# Patient Record
Sex: Male | Born: 1955 | Race: White | Hispanic: No | Marital: Single | Smoking: Former smoker
Health system: Southern US, Community
[De-identification: ages and names within clinical notes are randomized; demographics above are authoritative.]

## PROBLEM LIST (undated history)

## (undated) DIAGNOSIS — I4891 Unspecified atrial fibrillation: Secondary | ICD-10-CM

## (undated) DIAGNOSIS — J189 Pneumonia, unspecified organism: Secondary | ICD-10-CM

## (undated) DIAGNOSIS — J449 Chronic obstructive pulmonary disease, unspecified: Secondary | ICD-10-CM

## (undated) DIAGNOSIS — I2699 Other pulmonary embolism without acute cor pulmonale: Secondary | ICD-10-CM

## (undated) DIAGNOSIS — I10 Essential (primary) hypertension: Secondary | ICD-10-CM

## (undated) DIAGNOSIS — I96 Gangrene, not elsewhere classified: Secondary | ICD-10-CM

## (undated) DIAGNOSIS — I340 Nonrheumatic mitral (valve) insufficiency: Secondary | ICD-10-CM

## (undated) HISTORY — DX: Unspecified atrial fibrillation: I48.91

## (undated) HISTORY — DX: Essential (primary) hypertension: I10

## (undated) HISTORY — DX: Pneumonia, unspecified organism: J18.9

## (undated) HISTORY — DX: Nonrheumatic mitral (valve) insufficiency: I34.0

## (undated) HISTORY — DX: Gangrene, not elsewhere classified: I96

## (undated) HISTORY — DX: Other pulmonary embolism without acute cor pulmonale: I26.99

## (undated) HISTORY — DX: Chronic obstructive pulmonary disease, unspecified: J44.9

---

## 2007-04-08 ENCOUNTER — Ambulatory Visit: Payer: Self-pay | Admitting: Internal Medicine

## 2007-04-08 ENCOUNTER — Inpatient Hospital Stay (HOSPITAL_COMMUNITY): Admission: AD | Admit: 2007-04-08 | Discharge: 2007-05-18 | Payer: Self-pay | Admitting: Critical Care Medicine

## 2007-04-10 ENCOUNTER — Encounter: Payer: Self-pay | Admitting: Internal Medicine

## 2007-04-10 ENCOUNTER — Ambulatory Visit: Payer: Self-pay | Admitting: Surgery

## 2007-04-15 ENCOUNTER — Encounter: Payer: Self-pay | Admitting: Thoracic Surgery

## 2007-04-19 ENCOUNTER — Encounter (INDEPENDENT_AMBULATORY_CARE_PROVIDER_SITE_OTHER): Payer: Self-pay | Admitting: Pulmonary Disease

## 2007-04-30 ENCOUNTER — Ambulatory Visit: Payer: Self-pay | Admitting: Internal Medicine

## 2007-05-01 HISTORY — PX: THORACOTOMY: SUR1349

## 2007-06-13 ENCOUNTER — Encounter: Admission: RE | Admit: 2007-06-13 | Discharge: 2007-06-13 | Payer: Self-pay | Admitting: Thoracic Surgery

## 2007-06-13 ENCOUNTER — Ambulatory Visit: Payer: Self-pay | Admitting: Thoracic Surgery

## 2007-06-20 ENCOUNTER — Ambulatory Visit: Payer: Self-pay | Admitting: Internal Medicine

## 2007-06-20 ENCOUNTER — Ambulatory Visit: Payer: Self-pay | Admitting: Critical Care Medicine

## 2007-06-20 DIAGNOSIS — I059 Rheumatic mitral valve disease, unspecified: Secondary | ICD-10-CM | POA: Insufficient documentation

## 2007-06-20 DIAGNOSIS — J449 Chronic obstructive pulmonary disease, unspecified: Secondary | ICD-10-CM

## 2007-06-20 DIAGNOSIS — Z8679 Personal history of other diseases of the circulatory system: Secondary | ICD-10-CM

## 2007-06-20 DIAGNOSIS — J869 Pyothorax without fistula: Secondary | ICD-10-CM | POA: Insufficient documentation

## 2007-06-20 DIAGNOSIS — I10 Essential (primary) hypertension: Secondary | ICD-10-CM | POA: Insufficient documentation

## 2007-06-20 DIAGNOSIS — I4891 Unspecified atrial fibrillation: Secondary | ICD-10-CM

## 2007-07-10 ENCOUNTER — Ambulatory Visit: Payer: Self-pay | Admitting: Thoracic Surgery

## 2007-07-10 ENCOUNTER — Encounter: Admission: RE | Admit: 2007-07-10 | Discharge: 2007-07-10 | Payer: Self-pay | Admitting: Thoracic Surgery

## 2007-08-19 ENCOUNTER — Ambulatory Visit: Payer: Self-pay | Admitting: Cardiovascular Disease

## 2007-08-19 ENCOUNTER — Ambulatory Visit: Payer: Self-pay | Admitting: Critical Care Medicine

## 2007-08-19 DIAGNOSIS — Z86718 Personal history of other venous thrombosis and embolism: Secondary | ICD-10-CM | POA: Insufficient documentation

## 2007-08-26 ENCOUNTER — Telehealth: Payer: Self-pay | Admitting: Critical Care Medicine

## 2007-08-28 ENCOUNTER — Ambulatory Visit: Payer: Self-pay | Admitting: Critical Care Medicine

## 2007-09-11 ENCOUNTER — Telehealth: Payer: Self-pay | Admitting: Critical Care Medicine

## 2007-09-18 ENCOUNTER — Ambulatory Visit: Payer: Self-pay | Admitting: Thoracic Surgery

## 2007-10-30 ENCOUNTER — Ambulatory Visit: Payer: Self-pay | Admitting: Internal Medicine

## 2007-11-21 ENCOUNTER — Encounter: Admission: RE | Admit: 2007-11-21 | Discharge: 2007-11-21 | Payer: Self-pay | Admitting: Thoracic Surgery

## 2007-11-21 ENCOUNTER — Ambulatory Visit: Payer: Self-pay | Admitting: Thoracic Surgery

## 2008-03-02 ENCOUNTER — Telehealth (INDEPENDENT_AMBULATORY_CARE_PROVIDER_SITE_OTHER): Payer: Self-pay | Admitting: *Deleted

## 2008-03-06 ENCOUNTER — Ambulatory Visit: Payer: Self-pay | Admitting: Internal Medicine

## 2008-04-17 ENCOUNTER — Ambulatory Visit: Payer: Self-pay | Admitting: Critical Care Medicine

## 2008-04-17 DIAGNOSIS — G8922 Chronic post-thoracotomy pain: Secondary | ICD-10-CM

## 2008-04-24 ENCOUNTER — Encounter: Payer: Self-pay | Admitting: Critical Care Medicine

## 2008-05-06 IMAGING — CR DG ABD PORTABLE 1V
2 series · 2 of 2 positions shown · non-contrast
Comparison: 05/06/07.

CLINICAL DATA: Panda tube placement.
 PORTABLE ABDOMEN ? 1 VIEW:

[view not recorded (1 of 2)]
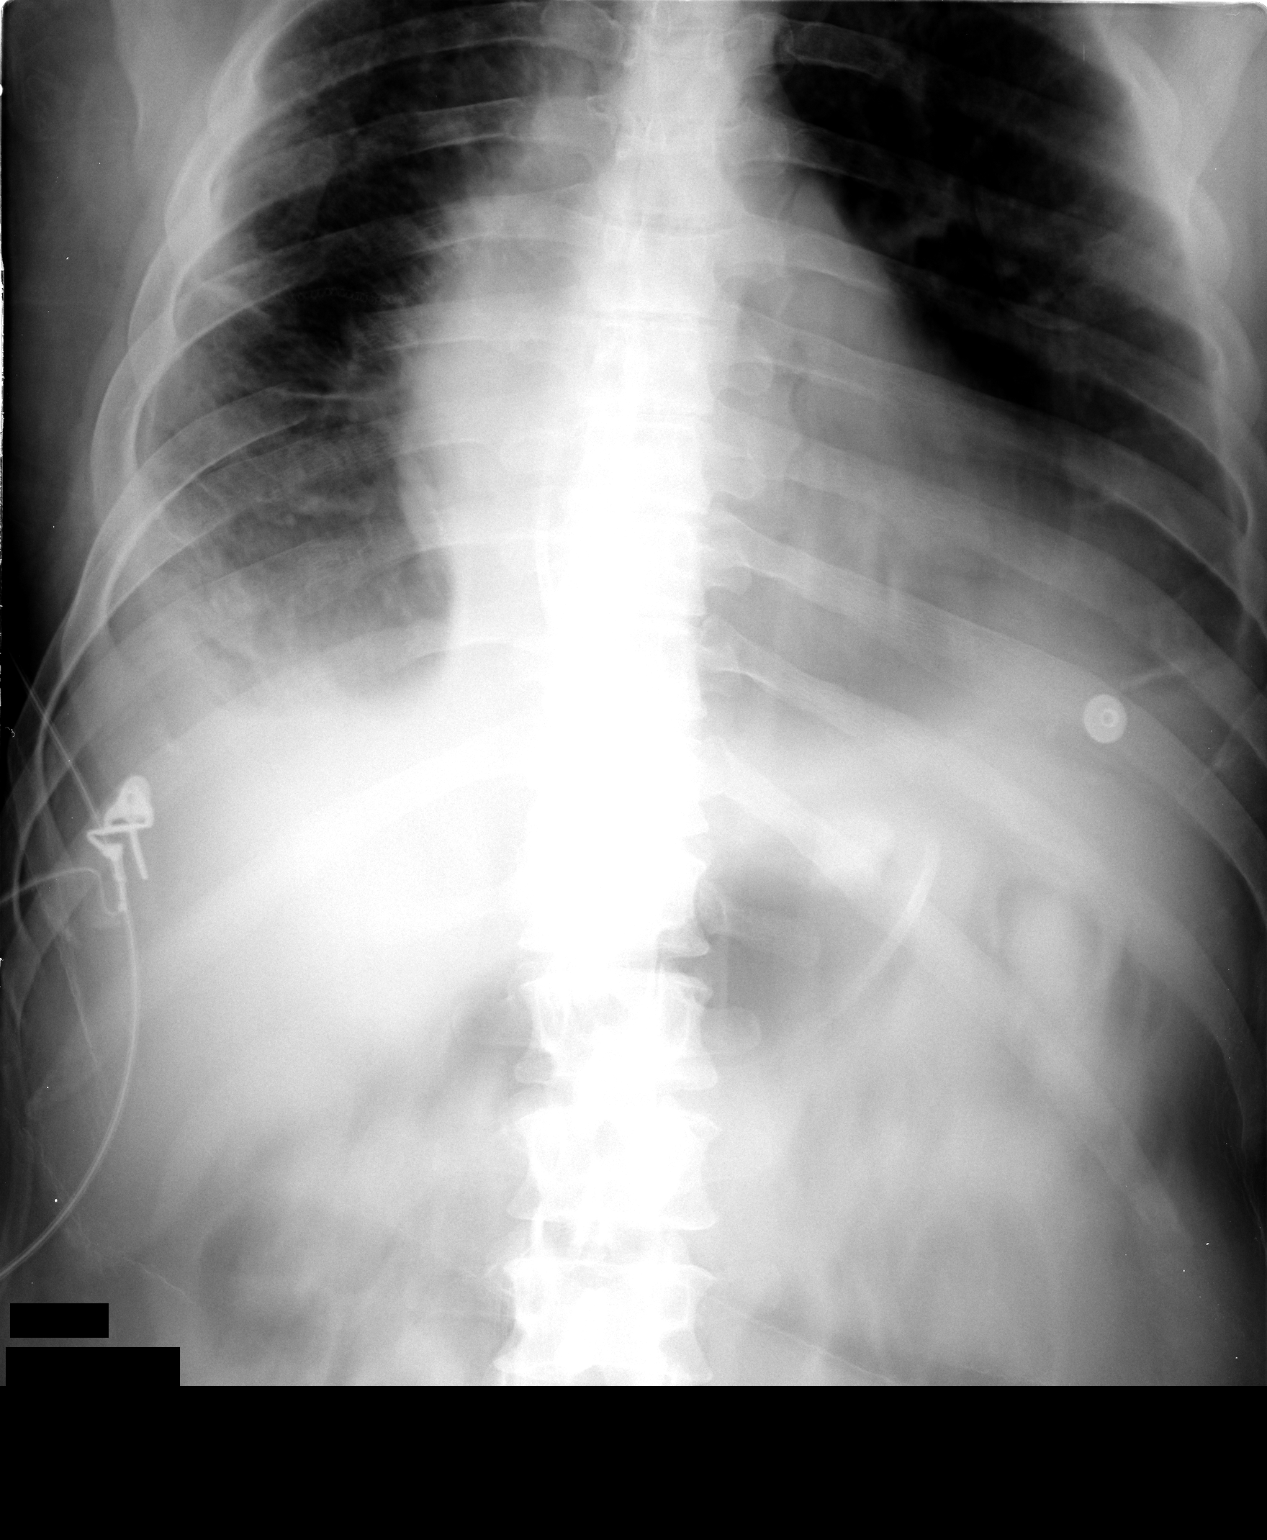

[view not recorded (2 of 2)]
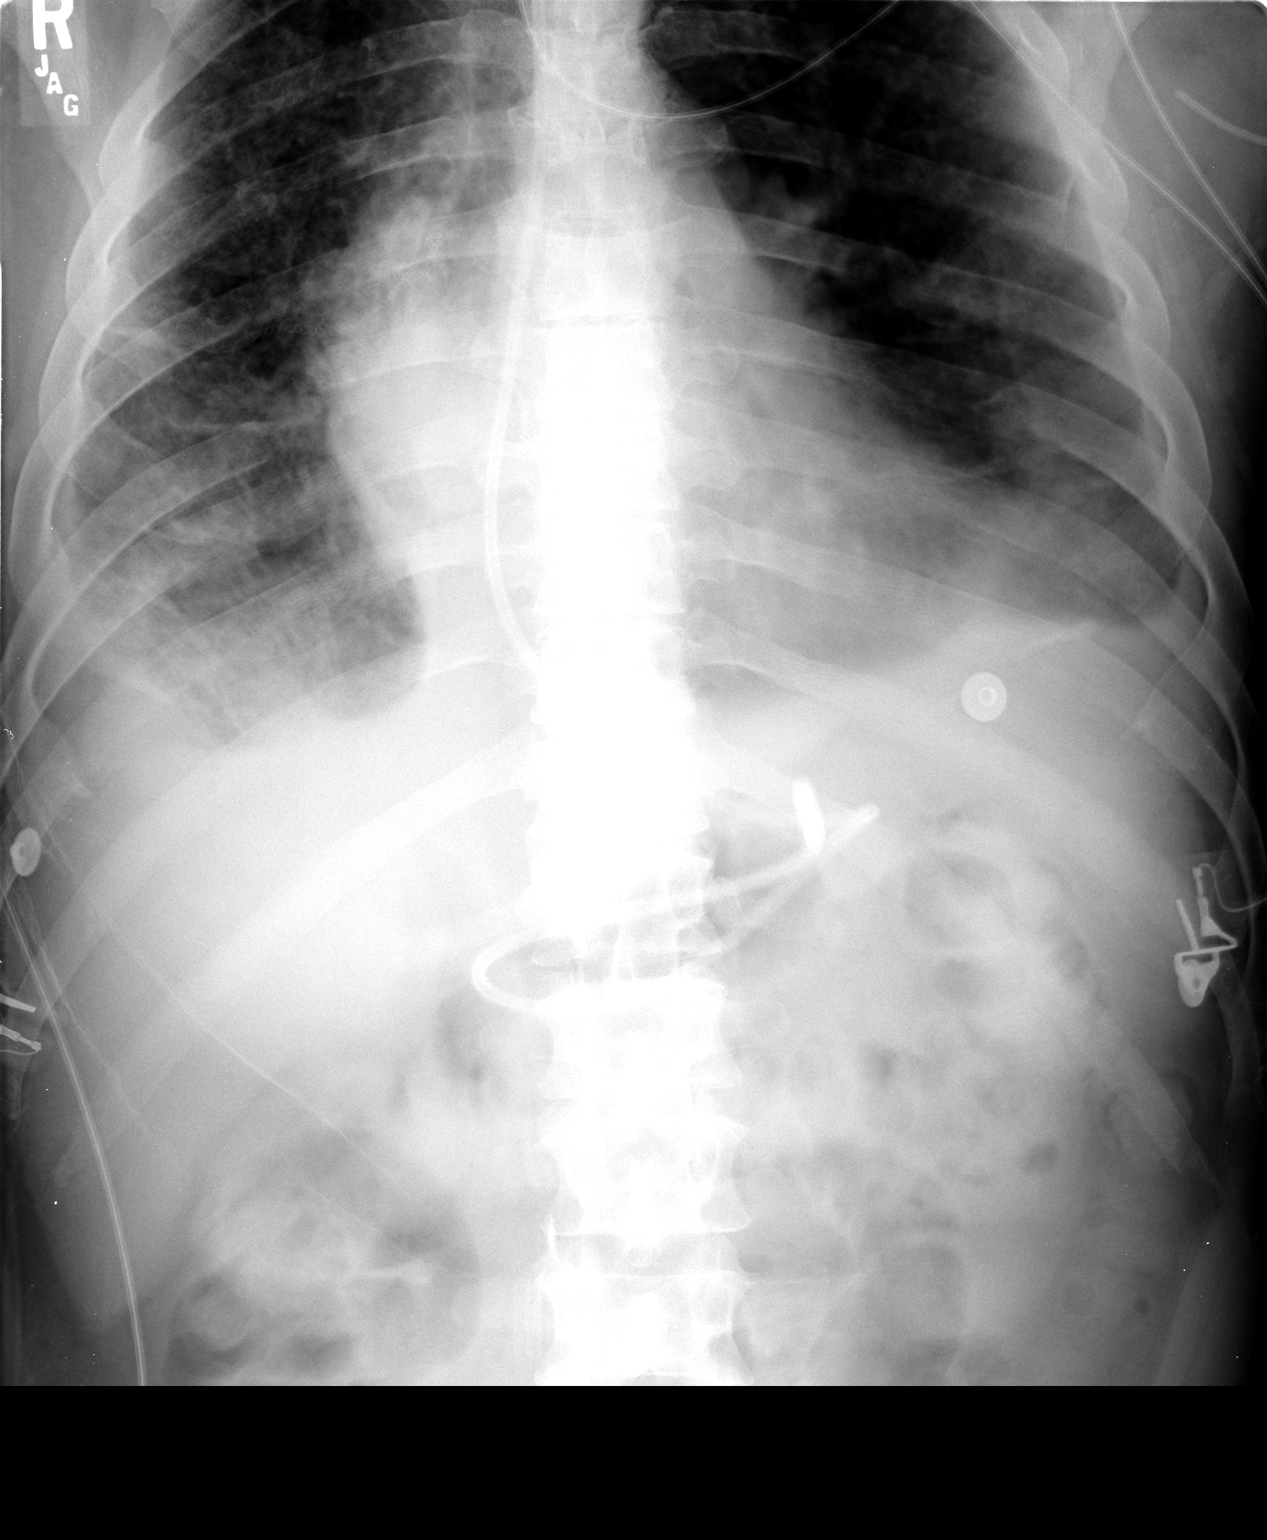

[2 of 2 positions shown; findings below may reference images not displayed]

FINDINGS: A Panda tube can be seen in the stomach, but there is severe breathing motion artifact, blurring the image so much that the tip cannot be localized.  One would need to repeat this exam if clinically warranted.
IMPRESSION: Severe breathing motion artifact limits visualization ? the tip of the Panda cannot be localized with certainty.
 ADDENDUM:
 An image of the abdomen was repeated at 6569 hours.  One can see that the Panda tube is coiled in the stomach with its tip in the fundus.

## 2008-05-08 IMAGING — CR DG CHEST 1V PORT
1 series · 1 of 1 positions shown · non-contrast
Comparison: Earlier study of the same date.

CLINICAL DATA: Pulmonary embolism. Respiratory failure. Trach changed.
 PORTABLE CHEST - 1 VIEW ([DATE]):

[view not recorded]
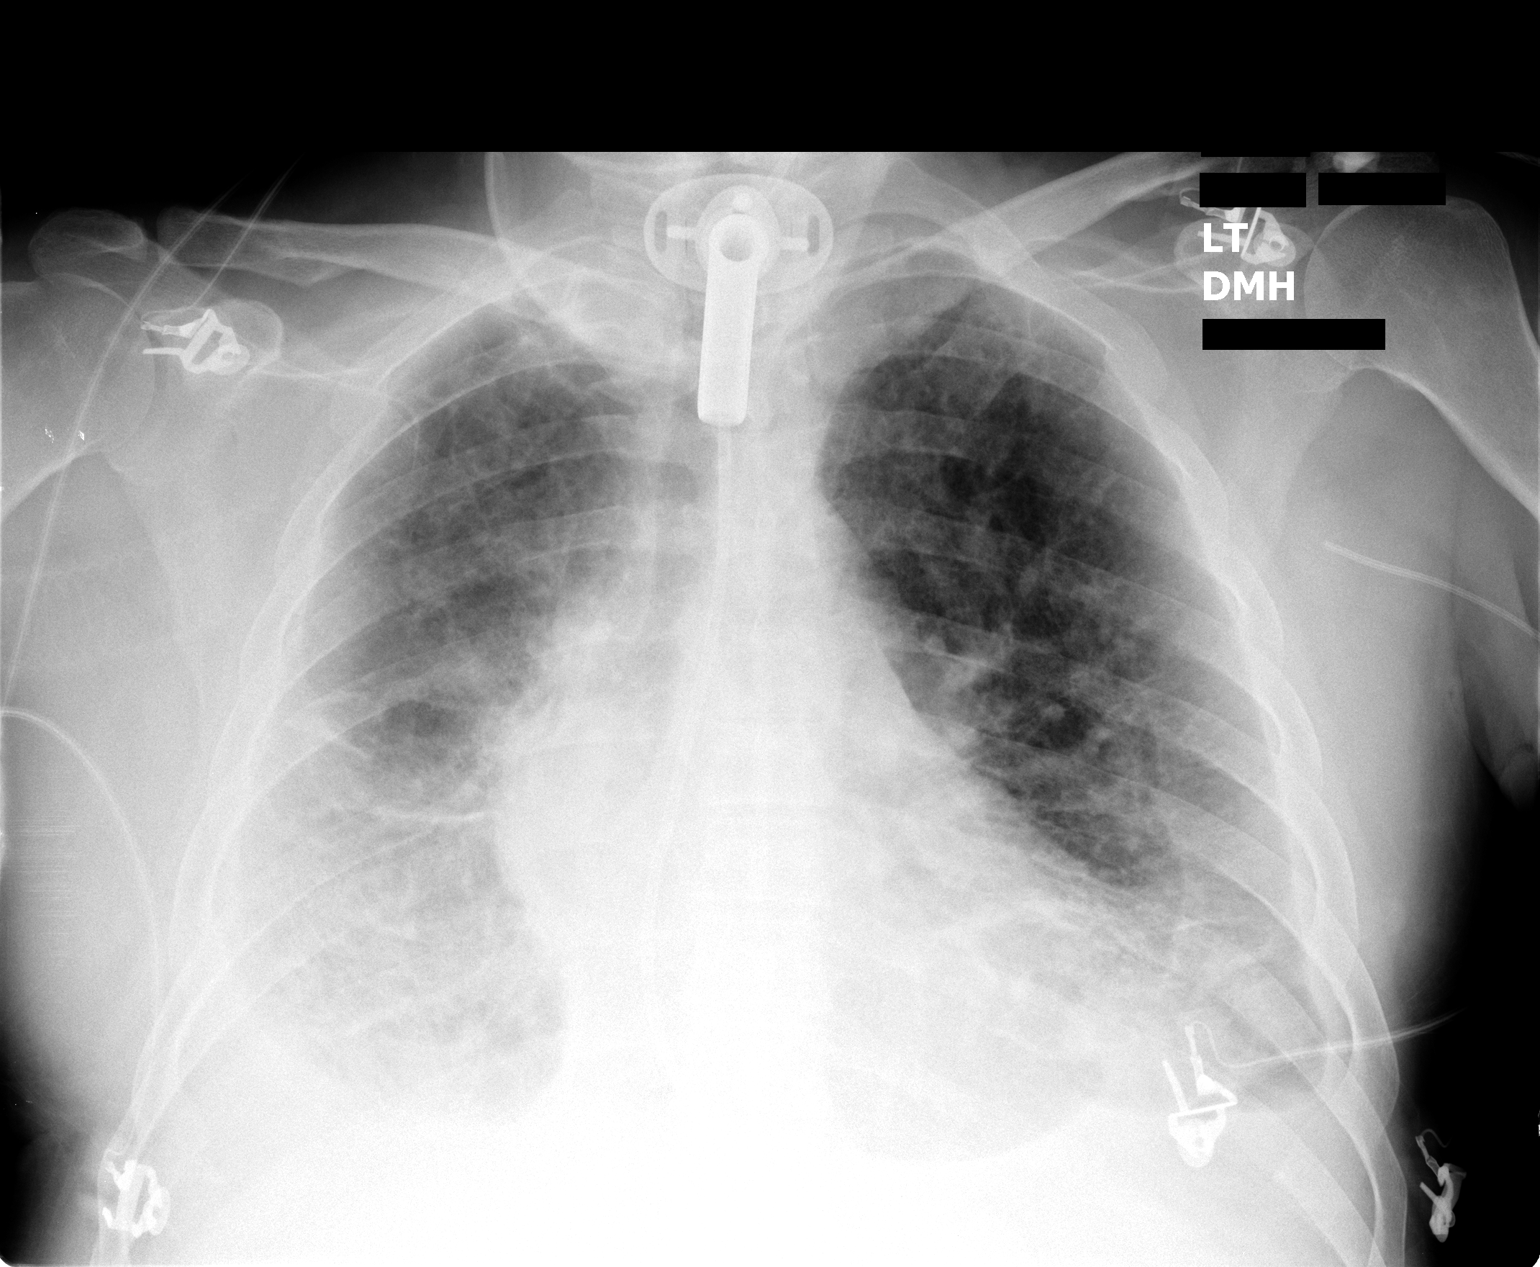

[1 of 1 positions shown; findings below may reference images not displayed]

FINDINGS: New tracheostomy tube has been inserted and appears in good position. Feeding tube is below the diaphragm.  Bilateral pleural effusions appear slightly less prominent but this is probably due to a change in position. Vascularity is slightly prominent. Left-sided PICC line tip is in the left axilla.
IMPRESSION: New tracheostomy tube in good position.

## 2008-05-09 IMAGING — CR DG CHEST 1V PORT
1 series · 1 of 1 positions shown · non-contrast
Comparison: 05/10/2007.

Portable semierect AP chest, 05/11/2007.
INDICATION: Pulmonary embolism.

[AP]
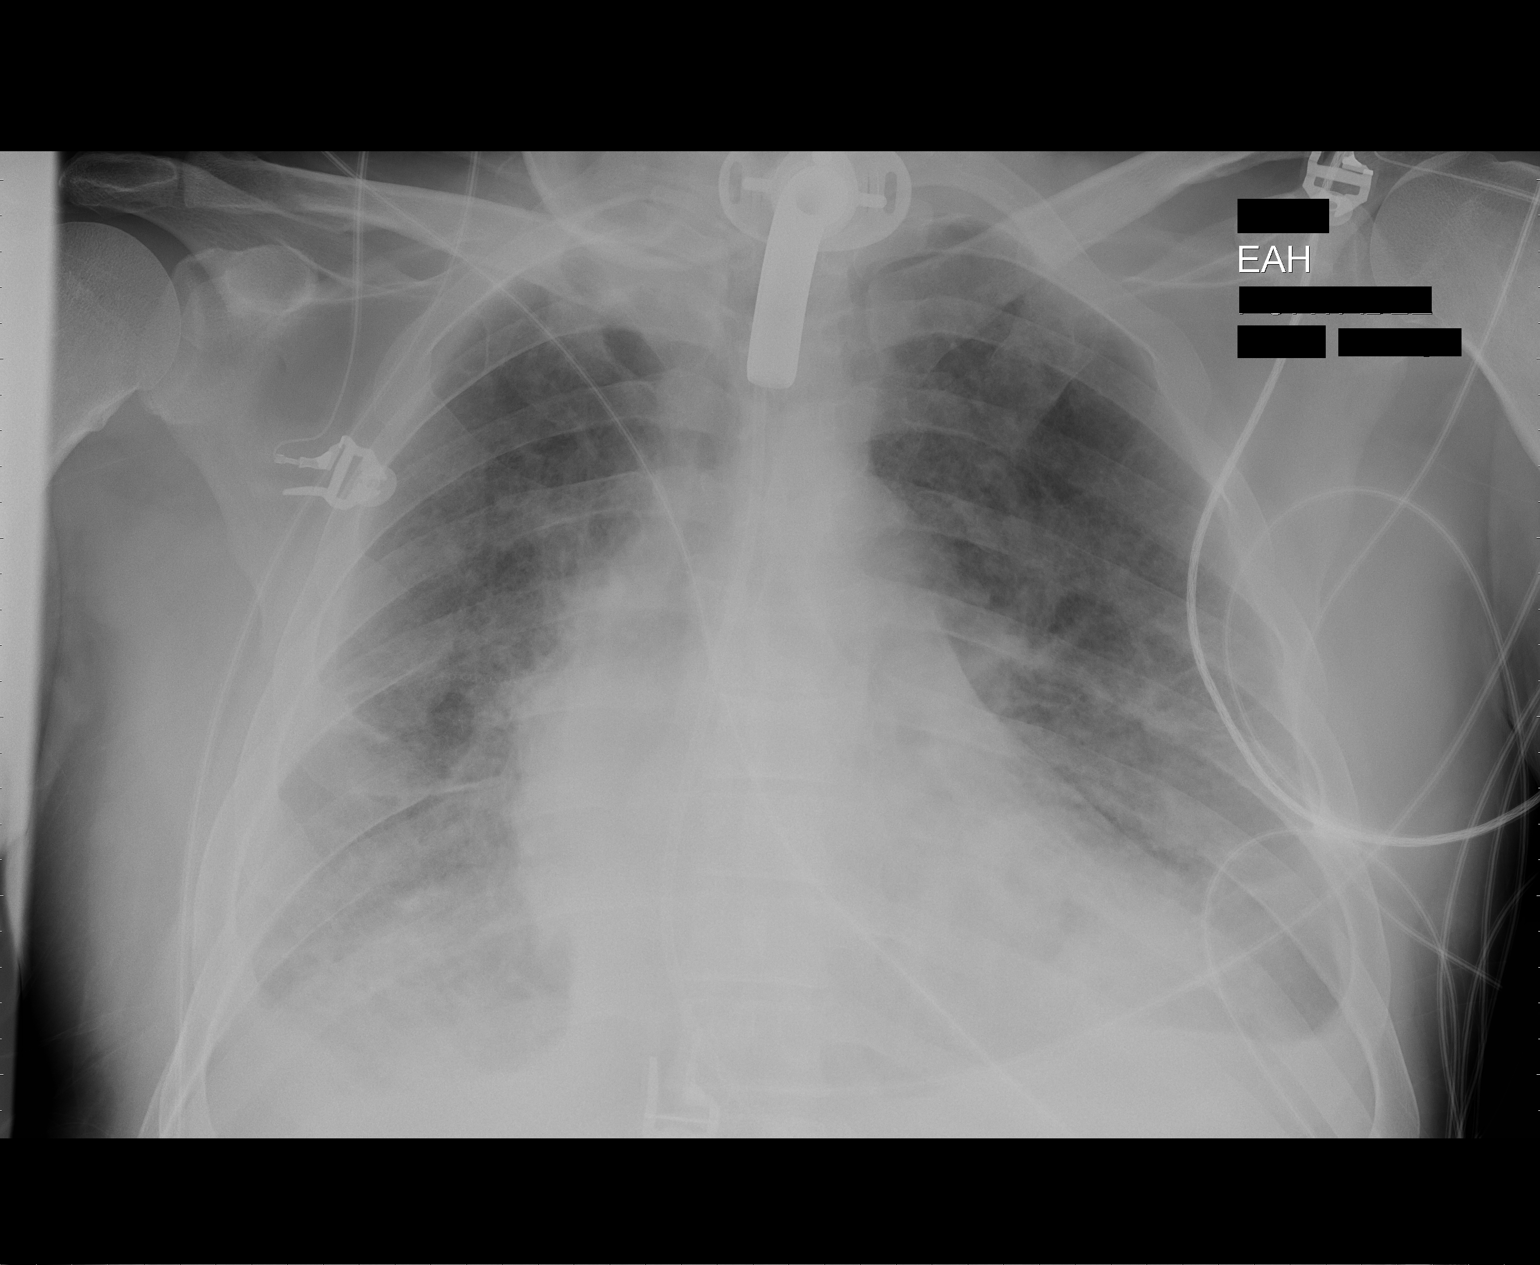

[1 of 1 positions shown; findings below may reference images not displayed]

FINDINGS: Tracheostomy and feeding tube unchanged. Unchanged appearance of
cardiomediastinal silhouette. Feeding tube tip is present in the proximal
stomach. Bilateral pleural effusions are present, layering dependently.
Unchanged pulmonary vascular congestion. Left-sided PICC not seen, likely
removed.
IMPRESSION: 1. Unchanged tracheostomy and feeding tube.
2. Left axillary PICC not seen, possibly removed.
3. Unchanged pleural effusions and pulmonary vascular congestion. No interval
changes in chest.

## 2008-05-12 IMAGING — CR DG CHEST 1V PORT
1 series · 1 of 1 positions shown · non-contrast
Comparison: 05/11/07

This report is delayed due to PACS failure.
CLINICAL DATA: Pulmonary embolism.  Basilar atelectasis.
 PORTABLE CHEST ? 1 VIEW ? 05/14/07 ? 7757 hours:

[view not recorded]
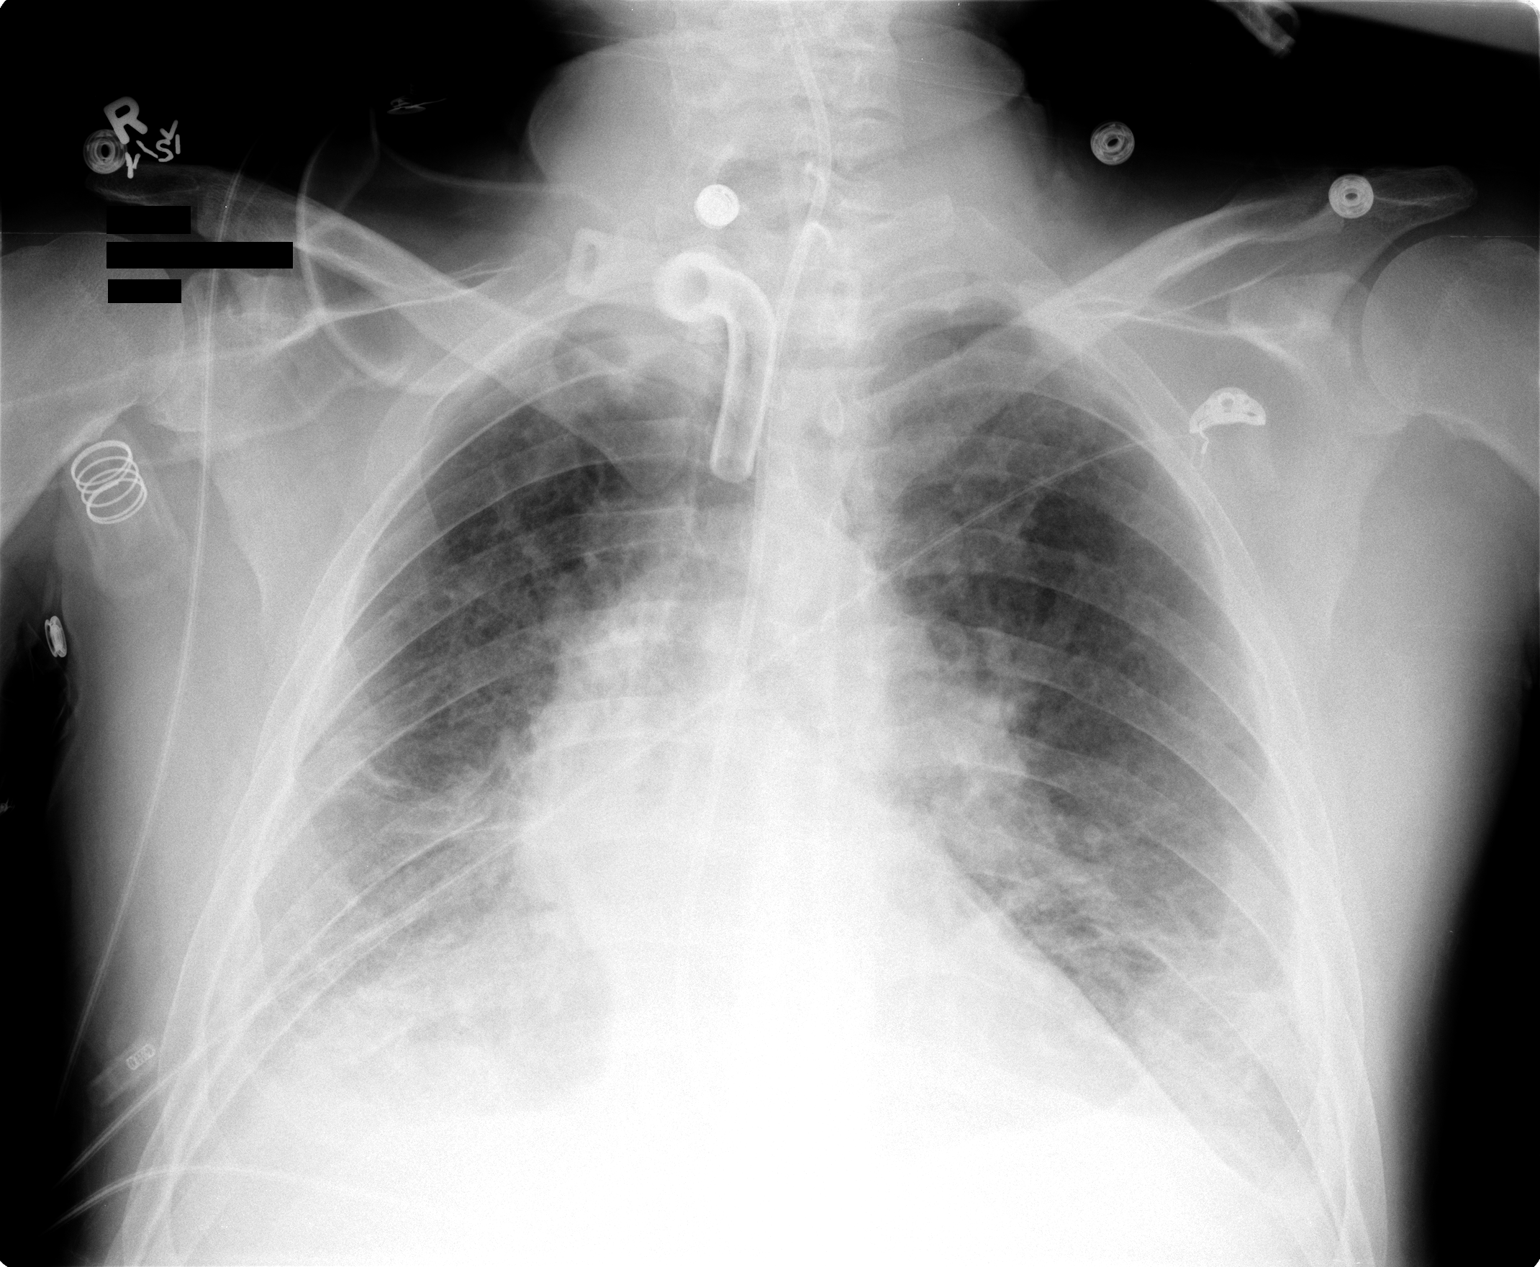

[1 of 1 positions shown; findings below may reference images not displayed]

FINDINGS: Aeration has improved as has the airspace disease.  Mild congestion and small effusions remain.  Tracheostomy is present and a feeding tube remains.
IMPRESSION: Improved aeration with some improvement in edema.

## 2008-06-17 ENCOUNTER — Ambulatory Visit: Payer: Self-pay | Admitting: Internal Medicine

## 2008-12-03 ENCOUNTER — Encounter: Payer: Self-pay | Admitting: Critical Care Medicine

## 2009-01-12 ENCOUNTER — Encounter (INDEPENDENT_AMBULATORY_CARE_PROVIDER_SITE_OTHER): Payer: Self-pay | Admitting: *Deleted

## 2009-02-11 ENCOUNTER — Encounter (INDEPENDENT_AMBULATORY_CARE_PROVIDER_SITE_OTHER): Payer: Self-pay | Admitting: *Deleted

## 2009-02-24 ENCOUNTER — Telehealth (INDEPENDENT_AMBULATORY_CARE_PROVIDER_SITE_OTHER): Payer: Self-pay | Admitting: *Deleted

## 2009-10-29 ENCOUNTER — Encounter: Payer: Self-pay | Admitting: Critical Care Medicine

## 2009-10-29 ENCOUNTER — Telehealth: Payer: Self-pay | Admitting: Critical Care Medicine

## 2010-03-14 ENCOUNTER — Encounter: Admission: RE | Admit: 2010-03-14 | Discharge: 2010-03-14 | Payer: Self-pay | Admitting: Orthopedic Surgery

## 2010-03-29 ENCOUNTER — Encounter: Admission: RE | Admit: 2010-03-29 | Discharge: 2010-03-29 | Payer: Self-pay | Admitting: Orthopedic Surgery

## 2010-06-05 ENCOUNTER — Encounter: Payer: Self-pay | Admitting: Thoracic Surgery

## 2010-06-14 NOTE — Progress Notes (Signed)
Summary: Pt needs to schedule OV with PW  Phone Note Outgoing Call   Call placed by: Gweneth Dimitri RN,  October 29, 2009 4:28 PM Summary of Call: Received CMN from [facility] for pt's o2.  Pt was last seen by PW 04/2208.  ATC pt to schedule OV with PW-- number we have on file is not a working number.  Spoke with Melissa at [facility] however the number they have on file for pt is the same number we have.  Informed Melissa PW will not be able to sign this CMN because pt has not been seen in the office in a year, we will send pt a letter to call our office, and informed her once pt has been seen to resend the CMN for PW to sign.  She verbalized understanding and stated she would make a note in pt's chart regarding this.  Will send letter to pt's home address to inform him to contact office.   Initial call taken by: Gweneth Dimitri RN,  October 29, 2009 4:36 PM

## 2010-06-14 NOTE — Letter (Signed)
Summary: [facility]  [address]   [place], [place] [address]   Phone: [phone]  Fax: [phone]    10/29/2009  [name] [address] [place], [place]  [address]  Dear [name],  Our records indicate you are overdue for a follow up.  We have tried to contact you, but the number we have on file is incorrect.  Inorder to continue to supply you with medical orders, please contact our office to schedule a follow up.     Sincerely,   Shan Levans, MD

## 2010-06-21 ENCOUNTER — Encounter: Payer: Self-pay | Admitting: Internal Medicine

## 2010-07-26 NOTE — Letter (Signed)
Summary: [facility] Office Visit   [facility] Office Visit   Imported By: Roderic Ovens 07/19/2010 15:30:02  _____________________________________________________________________  External Attachment:    Type:   Image     Comment:   External Document

## 2010-09-02 ENCOUNTER — Encounter: Payer: Self-pay | Admitting: Critical Care Medicine

## 2010-09-02 ENCOUNTER — Other Ambulatory Visit (INDEPENDENT_AMBULATORY_CARE_PROVIDER_SITE_OTHER): Payer: Medicaid Other

## 2010-09-02 ENCOUNTER — Ambulatory Visit (INDEPENDENT_AMBULATORY_CARE_PROVIDER_SITE_OTHER)
Admission: RE | Admit: 2010-09-02 | Discharge: 2010-09-02 | Disposition: A | Discharge status: Home / Self Care | Payer: Medicaid Other | Source: Ambulatory Visit | Attending: Critical Care Medicine | Admitting: Critical Care Medicine

## 2010-09-02 ENCOUNTER — Ambulatory Visit (INDEPENDENT_AMBULATORY_CARE_PROVIDER_SITE_OTHER): Payer: Medicaid Other | Admitting: Critical Care Medicine

## 2010-09-02 DIAGNOSIS — J449 Chronic obstructive pulmonary disease, unspecified: Secondary | ICD-10-CM

## 2010-09-02 DIAGNOSIS — I4891 Unspecified atrial fibrillation: Secondary | ICD-10-CM

## 2010-09-02 DIAGNOSIS — J159 Unspecified bacterial pneumonia: Secondary | ICD-10-CM

## 2010-09-02 DIAGNOSIS — R042 Hemoptysis: Secondary | ICD-10-CM

## 2010-09-02 LAB — CBC WITH DIFFERENTIAL/PLATELET
Basophils Absolute: 0 10*3/uL (ref 0.0–0.1)
Basophils Relative: 0.4 % (ref 0.0–3.0)
Eosinophils Absolute: 0.1 10*3/uL (ref 0.0–0.7)
Lymphs Abs: 2.1 10*3/uL (ref 0.7–4.0)
MCV: 101 fl — ABNORMAL HIGH (ref 78.0–100.0)
RBC: 4.16 Mil/uL — ABNORMAL LOW (ref 4.22–5.81)
WBC: 8 10*3/uL (ref 4.5–10.5)

## 2010-09-02 LAB — PROTIME-INR: INR: 5.3 ratio — ABNORMAL HIGH (ref 0.8–1.0)

## 2010-09-02 MED ORDER — CEFDITOREN PIVOXIL 400 MG PO TABS: 1.0000 | ORAL_TABLET | Freq: Two times a day (BID) | ORAL | Status: DC

## 2010-09-02 NOTE — Progress Notes (Signed)
Quick Note:  Called, spoke with pt. He is aware INR elevated, to hold coumadin through the weekend, and to call PCP to get INR rechecked early next week. He verbalized understanding of these results and instructions and stated he would call his PCP on Monday to have this done. ______

## 2010-09-02 NOTE — Progress Notes (Signed)
Subjective:    Patient ID: [name], male    DOB: [birth year], 55 y.o.   MRN: [id number]  HPI This is a 55 y.o.  , white male, history of COPD, former smoker, 11/08: pneumonia and pulmonary embolism with empyema and RML/RLL necrosis s/p RML and RLL lobectomy and decortication Dec/2008. s/p IVC filter placement. Ecoli was organism. . Surgical findings showed organized thromboembolus in the main pulmonary artery with focal organizing bronchopneumonia. There was also organized empyema. No malignancy was seen. There was a pleural peel also removed as well. The patient followed a very stormy postoperative course and require prolonged hospitalization with vent support. 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  April 17, 2008 1:56 PM Last Ov with pulm 4/09. No changes since that OV except for med calendar OV with NP 10/09. Pain still present but is less. Lyrica with out help. Still spells of dyspnea. Maintains advair. Occ prod cough of clear mucous. No wheeze. Pain remains incisional.   09/02/2010 Started with hemopytsis 4 days ago.  It is mixed with mucus.  If uses inhaler will bring up a black plug of mucus.  Notes some chest pain with deep breath.  Notes some fever and chills and sweats.   Notes more dyspnea.   Past Medical History  Diagnosis Date  . Pneumonia   . Atrial fibrillation   . COPD (chronic obstructive pulmonary disease)   . Hypertension   . Pulmonary embolism     with IVC filter placed  . Mitral valvular insufficiency   . Empyema   . Necrosis     RML, RLL     Family History  Problem Relation Age of Onset  . Stroke Father   . Heart attack Father      History   Social History  . Marital Status: Single    Spouse Name: N/A    Number of Children: N/A  . Years of Education: N/A   Occupational History  . Not on file.   Social History Main Topics  . Smoking status: Former Smoker -- 1.5 packs/day for 20 years    Types: Cigarettes    Quit date:  05/15/2005  . Smokeless tobacco: Never Used  . Alcohol Use: No  . Drug Use: No  . Sexually Active: Not on file   Other Topics Concern  . Not on file   Social History Narrative  . No narrative on file     No Known Allergies   No outpatient prescriptions prior to visit.      Review of Systems Constitutional:   No  weight loss, night sweats,  Fevers, chills, fatigue, lassitude. HEENT:   No headaches,  Difficulty swallowing,  Tooth/dental problems,  Sore throat,                No sneezing, itching, ear ache, nasal congestion, post nasal drip,   CV:  Notes R chest pain,  Orthopnea, PND, swelling in lower extremities, anasarca, dizziness, palpitations  GI  No heartburn, indigestion, abdominal pain, nausea, vomiting, diarrhea, change in bowel habits, loss of appetite  Resp: Notes  shortness of breath with exertion not at rest.  No excess mucus, no productive cough,  No non-productive cough,  No coughing up of blood.  No change in color of mucus.  No wheezing.  No chest wall deformity  Skin: no rash or lesions.  GU: no dysuria, change in color of urine, no urgency or frequency.  No flank pain.  MS:  No joint pain or swelling.  No decreased range of motion.  No back pain.  Psych:  No change in mood or affect. No depression or anxiety.  No memory loss.     Objective:   Physical Exam Gen: Pleasant, well-nourished, in no distress,  normal affect  ENT: No lesions,  mouth clear,  oropharynx clear, no postnasal drip  Neck: No JVD, no TMG, no carotid bruits  Lungs: No use of accessory muscles, no dullness to percussion , decreased BS on L Cardiovascular: RRR, heart sounds normal, no murmur or gallops, no peripheral edema  Abdomen: soft and NT, no HSM,  BS normal  Musculoskeletal: No deformities, no cyanosis or clubbing  Neuro: alert, non focal  Skin: Warm, no lesions or rashes      CXR: 4/20 1. Hyperinflation with bilateral right greater than left pleural  parenchymal  scarring.  2. No convincing evidence of acute superimposed process.   Assessment & Plan:   C O P D Moderate Copd  Now with new RLL infiltrate   Plan Assess coags spectraceph bid x 10days     Updated Medication List Outpatient Encounter Prescriptions as of 09/02/2010  Medication Sig Dispense Refill  . digoxin (LANOXIN) 0.25 MG tablet Take 250 mcg by mouth daily.        Marland Kitchen esomeprazole (NEXIUM) 40 MG capsule Take 40 mg by mouth daily before breakfast.        . Fluticasone-Salmeterol (ADVAIR DISKUS) 250-50 MCG/DOSE AEPB 1 puff daily and at bedtime as needed       . HYDROcodone-acetaminophen (VICODIN) 5-500 MG per tablet Take 1 tablet by mouth every 8 (eight) hours as needed.        . metoprolol tartrate (LOPRESSOR) 25 MG tablet Take 25 mg by mouth 2 (two) times daily.        . niacin-simvastatin (SIMCOR) 500-20 MG 24 hr tablet Take 1 tablet by mouth at bedtime.        Marland Kitchen warfarin (COUMADIN) 5 MG tablet Take as directed       . Cefditoren Pivoxil (SPECTRACEF) 400 MG TABS Take 1 tablet (400 mg total) by mouth 2 (two) times daily.  14 each  0

## 2010-09-02 NOTE — Patient Instructions (Signed)
Labs today Start Spectraceph one twice daily for 7days See Tammy Parrett for recheck in 7days

## 2010-09-03 NOTE — Assessment & Plan Note (Signed)
Moderate Copd  Now with new RLL infiltrate   Plan Assess coags spectraceph bid x 10days

## 2010-09-07 ENCOUNTER — Encounter: Payer: Self-pay | Admitting: Adult Health

## 2010-09-09 ENCOUNTER — Ambulatory Visit (INDEPENDENT_AMBULATORY_CARE_PROVIDER_SITE_OTHER): Payer: Medicaid Other | Admitting: Adult Health

## 2010-09-09 ENCOUNTER — Encounter: Payer: Self-pay | Admitting: Adult Health

## 2010-09-09 VITALS — BP 112/82 | HR 85 | Temp 97.8°F | Ht 66.0 in | Wt 222.0 lb

## 2010-09-09 DIAGNOSIS — J449 Chronic obstructive pulmonary disease, unspecified: Secondary | ICD-10-CM

## 2010-09-09 NOTE — Assessment & Plan Note (Signed)
Exacerbation now improved  Plan:  Finish Prednisone  mucinex dm Twice daily  As needed   Fluids and rest  follow up Dr. Delford Field  In 4 weeks and As needed

## 2010-09-09 NOTE — Progress Notes (Addendum)
Subjective:    Patient ID: [name], male    DOB: Jul 19, 1955, 55 y.o.   MRN: [id number]  HPI This is a 55 y.o.  , white male, history of COPD, former smoker, 11/08: pneumonia and pulmonary embolism with empyema and RML/RLL necrosis s/p RML and RLL lobectomy and decortication Dec/2008. s/p IVC filter placement. Ecoli was organism. . Surgical findings showed organized thromboembolus in the main pulmonary artery with focal organizing bronchopneumonia. There was also organized empyema. No malignancy was seen. There was a pleural peel also removed as well. The patient followed a very stormy postoperative course and require prolonged hospitalization with vent support. 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   April 17, 2008 1:56 PM Last Ov with pulm 4/09. No changes since that OV except for med calendar OV with NP 10/09. Pain still present but is less. Lyrica with out help. Still spells of dyspnea. Maintains advair. Occ prod cough of clear mucous. No wheeze. Pain remains incisional.   09/02/2010 Started with hemopytsis 4 days ago.  It is mixed with mucus.  If uses inhaler will bring up a black plug of mucus.  Notes some chest pain with deep breath.  Notes some fever and chills and sweats.   Notes more dyspnea.  >>Tx for PNA with spectracef  INR was elevated at 5.3 -coumadin held   09/09/2010 Follow up  Pt returns for 1 week follow up. Last ov with AECOPD w/ possible PNA. CXR report with chronic changes. Pt did have elevated INR 5.3 , coumadin was held , he restarted yesterday. Had no hemoptysis. He is feeling much better with decreased cough/congesetion. Eating well. Breathing back to baseline. Still have some congestion with clear mucus. Has finished spectracef.   He has follow up next week for coumadin recheck.    Review of Systems Constitutional:   No  weight loss, night sweats,  Fevers, chills,   HEENT:   No headaches,  Difficulty swallowing,  Tooth/dental  problems, or  Sore throat,                No sneezing, itching, ear ache, nasal congestion, post nasal drip,   CV:  No chest pain,  Orthopnea, PND, swelling in lower extremities, anasarca, dizziness, palpitations, syncope.   GI  No heartburn, indigestion, abdominal pain, nausea, vomiting, diarrhea, change in bowel habits, loss of appetite, bloody stools.   Resp: ,  No coughing up of blood.  No change in color of mucus.  No wheezing.  No chest wall deformity  Skin: no rash or lesions.  GU: no dysuria, change in color of urine, no urgency or frequency.  No flank pain, no hematuria   MS:  No joint pain or swelling.  No decreased range of motion.  No back pain.  Psych:  No change in mood or affect. No depression or anxiety.  No memory loss.         Objective:   Physical Exam GEN: A/Ox3; pleasant , NAD, elderly chronically ill appearing.   HEENT:  [place]/AT,  EACs-clear, TMs-wnl, NOSE-clear, THROAT-clear, no lesions, no postnasal drip or exudate noted.   NECK:  Supple w/ fair ROM; no JVD; normal carotid impulses w/o bruits; no thyromegaly or nodules palpated; no lymphadenopathy.  RESP  Coarse BS w/ few rhonchi no wheezing no accessory  muscle use, no dullness to percussion  CARD:  RRR, no m/r/g  , tr-1+  peripheral edema, pulses intact, no cyanosis or clubbing.  GI:  Soft & nt; nml bowel sounds; no organomegaly or masses detected.  Musco: Warm bil, no deformities or joint swelling noted.   Neuro: alert, no focal deficits noted.    Skin: Warm, no lesions or rashes        Assessment & Plan:

## 2010-09-09 NOTE — Patient Instructions (Addendum)
follow up for coumadin recheck as planned next week.  Mucinex DM Twice daily  As needed  Cough/congesiton  Fluids and rest.  follow up Dr. Delford Field  In 4 weeks

## 2010-09-16 ENCOUNTER — Ambulatory Visit: Payer: Self-pay | Admitting: Critical Care Medicine

## 2010-09-27 NOTE — Assessment & Plan Note (Signed)
[facility]                         ELECTROPHYSIOLOGY OFFICE NOTE   SHAYDE, [name]                           MRN:          [id number]  DATE:06/20/2007                            DOB:          07-22-1955    Mr. Kenneth returns today for followup.  He is a very pleasant middle-aged  man with chronic atrial fibrillation and rheumatic heart disease.  He  has a history of recurrent lung infections, including empyema and  pulmonary hemorrhage, and was hospitalized for several weeks back in  December 2008, with the above problems.  A 2D echocardiogram at that  time demonstrated mild mitral stenosis.  His overall LV function is  preserved, with his EF of approximately 50%.  The patient returns today  for followup.  He states that he has been hospitalized at [facility] twice in the last month briefly for pneumonia, being treated  with IV antibiotics before improving.  He denies chest pain, but he does  note dyspnea with exertion.  He states that it feels like the air is  heavy.  He has been given a prescription for oxygen, but he is not  wearing it today.   MEDICATIONS:  With which he is questionably compliant, include:  1. Digoxin 0.25 a day.  2. Metoprolol 25 twice daily.  3. Warfarin as directed.  4. Lisinopril 10 a day.  5. Diltiazem 240 a day.  6. Simvastatin 40 a day.   PHYSICAL EXAMINATION:  GENERAL:  He is a pleasant, chronically ill-  appearing, middle-aged man in no distress.  VITAL SIGNS:  Blood pressure was 123/86, the pulse was 60 and irregular,  respirations were 18, the weight was 193 pounds.  NECK:  Revealed no jugular distention.  LUNGS:  Clear bilaterally to auscultation.  No wheezes, rales, or  rhonchi.  Breath sounds were decreased in the right base.  CARDIOVASCULAR:  Revealed an irregular rhythm, with normal S1 and S2.  I  could not appreciate any diastolic rumble.  The PMI was not enlarged or  laterally displaced.  ABDOMEN:   Soft and nontender.  The extremities demonstrated no edema.   EKG demonstrates atrial fibrillation, with a slow ventricular response  and a rightward axis.   Today, we had Mr. Gellis ambulate on pulse oximetry.  His initial heart  rate was in the mid-60s with standing, oxygen saturation in the mid-90s  initially.  After walking approximately 30 yards, the patient's oxygen  saturation was noted to decrease down into the mid-80s.  His heart rate  increased up to the 110-120 range.  At this point, the patient had to  stop and rest for a moment.  Eventually, he recovered continued his  walk, with recurrent desaturations during his walk after walking every  30-40 yards.  Each time, the oxygen saturation would drop into the mid-  80s.  He was not wearing supplemental oxygen at the time.   IMPRESSION:  1. Chronic atrial fibrillation.  2. Rheumatic heart disease, with mild mitral stenosis based on 2D      echocardiogram.  3. Underlying lung disease.  4. Status post pneumonia with empyema, status post decortication.   DISCUSSION:  Mr. Smithson is stable, but he is clearly class II-III with  regard to his symptoms of dyspnea.  While the atrial fibrillation may be  playing a role with this, it is not my impression that he is volume  overloaded.  He is not on Lasix, though we might consider giving him  some.  I have encouraged him because of his oxygen desaturation with  exertion to wear oxygen on a regular basis.  The patient states that he  is seeing Dr. Delford Field in our pulmonary clinic later today.  I have  encouraged him to follow up.  He is not on any bronchodilators or  steroids, and I will defer to Dr. Lynelle Doctor decision about this as well  as the indication for pulmonary function testing.  Whether there is  residual inflammation making his dyspnea worse is still unclear to me.  I will see the patient back in several months.     Doylene Canning. Ladona Ridgel, MD  Electronically Signed    GWT/MedQ  DD:  06/20/2007  DT: 06/21/2007  Job #: [id number]

## 2010-09-27 NOTE — Letter (Signed)
July 10, 2007   Charlcie Cradle. Delford Field, MD, FCCP  [address]  [place], [place] [address]   Re:  WINTHROP, SHANNAHAN              DOB:  [birth year]   Dear [name]:   I saw the patient back for followup today.  He is having some mild post-  thoracotomy pain in his right chest.  His incisions are well-healed.  His tracheostomy is well-healed.  Chest x-ray reveals small bilateral  effusions with some scarring.   His blood pressure is 120/80, pulse 80, respirations 18, temperature  97%.   Overall, he is doing well.  He has run out of his digoxin.  I have given  him a refill for his digoxin.  From my standpoint, I think he has done  satisfactorily and I will not need to see him back any more.  I am  somewhat concerned about his mitral stenosis, which probably caused a  lot of his problems, and recommended that he follow up with cardiologist  to continue to evaluate him.  He will be seeing you back in the near  future.  I appreciate the opportunity of seeing the patient.   Ines Bloomer, M.D.  Electronically Signed   DPB/MEDQ  D:  07/10/2007  T:  07/11/2007  Job:  [id number]

## 2010-09-27 NOTE — Letter (Signed)
June 13, 2007   Charlcie Cradle. Delford Field, MD, FCCP  [address]  [place], [place]  [address]   Re:  [name], [name]              DOB:  December 04, 1955   Dear [name]:   I saw [name] back for followup today.  This is the first time he has  come back since he was discharged.  He says he was admitted to the  hospital 2 times with shortness of breath at [facility], and that  is the reason he did not return for his followup appointments.  Chest x-  ray showed some small, bilateral effusions, aerations in both chest.  His temperature was 97.  Blood pressure was 151/100.  Pulse 58.  Respirations 20.  Incisions were well healed, including his tracheostomy  site.  I had him to get an appointment with you and Dr. Lewayne Bunting for  followup of both his pulmonary disease, as well as his lung disease.   I feel that he may benefit from mitral valve surgery in the near future,  but I will let the cardiologist decide about that.  Dr. Laneta Simmers would be  the one to do his surgery should that come to that.   I will see him back in, in 3 weeks with the chest x-ray.   Ines Bloomer, M.D.  Electronically Signed   DPB/MEDQ  D:  06/13/2007  T:  06/13/2007  Job:  [id number]   cc:   Doylene Canning. Ladona Ridgel, MD

## 2010-09-27 NOTE — Assessment & Plan Note (Signed)
[facility]                         ELECTROPHYSIOLOGY OFFICE NOTE   MAKHAI, FULCO                           MRN:          [id number]  DATE:06/17/2008                            DOB:          [birth year]    Mr. Kluever returns today for followup.  He is a very pleasant middle-aged  male with remote recurrent pneumonias and empyemas, status post  thoracotomy.  He has a history of atrial fibrillation which has been  chronic for many years.  He has a history of COPD.  He has a history of  mitral stenosis secondary to rheumatic heart disease, which has been  mild-to-worst.  He returns today for followup.  He has continued to stay  on home oxygen, though he does not use it all the time as his pulmonary  status has improved in the last 6 months and really improved in the last  year.  He notes that when he does something strenuous he will get short  of breath, but doing activities of daily living are mostly pretty easy  for him.   CURRENT MEDICATIONS:  1. Digoxin 0.25 a day.  2. Metoprolol 25 twice daily.  3. Warfarin as directed.  4. Advair Diskus inhaler.  5. Omeprazole 20 a day.  6. He is no longer on carvedilol.   PHYSICAL EXAMINATION:  GENERAL:  He is a pleasant well-appearing middle-  aged man in no distress.  VITAL SIGNS:  Blood pressure was 114/80, the pulse was 60 and irregular,  the respirations were 18, and the weight was 212 pounds.  NECK:  No jugular venous distention.  LUNGS:  Clear bilaterally to auscultation.  No wheezes, rales, or  rhonchi are present.  There is no increased work of breathing.  Breath  sounds were somewhat decreased throughout.  ABDOMEN:  Soft and nontender.  EXTREMITIES:  No edema.   EKG demonstrates atrial fibrillation with a slow ventricular response  and rightward axis was present.   IMPRESSION:  1. Severe chronic obstructive pulmonary disease, status post multiple      lung infections, now stable.  2. Chronic  atrial fibrillation, also stable with adequate rate control      on beta-blockers and digoxin.  3. Chronic Coumadin therapy secondary to chronic atrial fibrillation.  4. Mitral stenosis with no severe disease at present.  5. Atypical chest pain, which is pleuritic in nature and I have      recommended ibuprofen as      needed for this.  We will plan to see the patient back in the      office in 6 months.  At that time, we will repeat his 2-D echo to      see how much his mitral stenosis has progressed.     Doylene Canning. Ladona Ridgel, MD  Electronically Signed    GWT/MedQ  DD: 06/17/2008  DT: 06/18/2008  Job #: [id number]

## 2010-09-27 NOTE — Letter (Signed)
Sep 18, 2007   Charlcie Cradle. Delford Field, MD, FCCP  [address]  [place], [place]  [address]   Re:  [name], [name]              DOB:  29-Jul-1955   Dear [name]:   I saw Mr. Keating back in the office today.  He is having some post  thoracotomy pain, but this seems to be improved on Ultram so I gave him  a prescription for Ultram #60 with three refills.  His CT scan, I  thought looked remarkably well, but he still says he gets short of  breath with any type of exertion and apparently Medicaid is having  trouble paying for his oxygen.  So, he may need to have a stress O2 SAT  monitoring regarding his oxygen.  He will be seeing the cardiologist in  the near future because I think a lot of this has to do with his mitral  valve disease.  I plan to see him back again in 2 months with a chest x-  ray.   Ines Bloomer, M.D.  Electronically Signed   DPB/MEDQ  D:  09/18/2007  T:  09/18/2007  Job:  [id number]

## 2010-09-27 NOTE — Op Note (Signed)
NAMEBLAS, RICHES NO.:  [id number]   MEDICAL RECORD NO.:  [id number]          PATIENT TYPE:  INP   LOCATION:  2303                         FACILITY:  [facility]   PHYSICIAN:  Ines Bloomer, M.D. DATE OF BIRTH:  1955-08-09   DATE OF PROCEDURE:  DATE OF DISCHARGE:                               OPERATIVE REPORT   PREOPERATIVE DIAGNOSIS:  Respiratory failure.   POSTOPERATIVE DIAGNOSIS:  Respiratory failure.   OPERATION PERFORMED:  Percutaneous tracheostomy.   SURGEON:  Ines Bloomer, M.D.   ANESTHESIA:  General anesthesia.   After adequate general anesthesia, the patient was placed in the supine  position.  The neck was prepped and draped in the usual sterile manner.  A transverse incision was made at sternal notch for approximately 1.5  cm, and dissection was carried down to the second tracheal ring.  Then,  a video bronchoscope was inserted, and the tube was pulled back to just  below the cords, and under direct visualization with the video  bronchoscope, we inserted first a needle and then the guidewire, and  then after the needle was inserted, then a small dilator and then the  graduated dilator, and then finally over the guidewire inserted a #8  trache tube without difficulty.  Guidewire and obturator were removed,  and the Shiley trache was then sutured in place with 3-0 silk and the  balloon inflated, and the inner cannula then placed, and the patient was  placed back on the ventilator.  A Dale tracheostomy holder was placed.  The patient was returned to the ACU in stable condition.      Ines Bloomer, M.D.  Electronically Signed     DPB/MEDQ  D:  04/25/2007  T:  04/25/2007  Job:  [id number]

## 2010-09-27 NOTE — Assessment & Plan Note (Signed)
[name]                         ELECTROPHYSIOLOGY OFFICE NOTE   [name], [name]                           MRN:          [id number]  DATE:10/30/2007                            DOB:          05/09/1956    HISTORY OF PRESENT ILLNESS:  [name] returns today for followup visit.  [name] is a very pleasant 55 year old male with a history of COPD; pulmonary  embolism; and empyema, status post thoracotomy.  [name] also has a history  of chronic atrial fibrillation as well as rheumatic heart disease and  mild mitral valve stenosis.  [name] returns today for followup.  [name]  continues to complain of dyspnea with exertion but denies chest pain.  [name] has very minimal palpitations.   CURRENT MEDICATIONS:  1. Digoxin 0.25 mg daily.  2. Metoprolol 25 twice daily.  3. Warfarin as directed.  4. Advair Diskus.  5. Omeprazole 20 a day.  6. Questionable carvedilol 3.125 twice daily.  7. [name] also has a Proventil inhaler.   PHYSICAL EXAMINATION:  On physical examination, [name] is a pleasant  chronically ill-appearing middle-aged man in no distress.  George blood  pressure was 122/90, George pulse was 42 and irregular, respirations were  18, and George weight was 209 pounds.  [name] was walked in George  hallway today.  His heart rate, which was initially in George low to mid 40  increased up to 110 beats per minute.  His oxygen saturation went from  George mid 90s on room air initially down into George mid to high 80s before  increasing back to George low 90s with rest.   IMPRESSION:  1. Severe chronic obstructive pulmonary disease.  2. History of empyema and pneumonia.  3. Atrial fibrillation.  4. Rheumatic heart disease.  5. Diastolic heart failure secondary to rheumatic heart disease and      atrial fibrillation.   DISCUSSION:  [name] is stable.  [name] is improved from his extensive  hospitalization back in George fall.  [name] still has dyspnea with exertion.  My plan will be to see George  Rogers back in George office in several months.  With regard to oxygen saturation, his saturation is still dropped into  George mid  to high 80s with exertion and for this reason I would recommend  continuation of his home oxygen therapy.     Doylene Canning. Ladona Ridgel, MD  Electronically Signed    GWT/MedQ  DD: 10/30/2007  DT: 10/31/2007  Job #: [id number]   cc:   Charlaine Dalton. Sherene Sires, MD, FCCP  [facility]

## 2010-09-27 NOTE — Consult Note (Signed)
[name], Rogers NO.:  [id number]   MEDICAL RECORD NO.:  [id number]          PATIENT TYPE:  INP   LOCATION:  2111                         FACILITY:  [facility]   PHYSICIAN:  Evelene Croon, M.D.     DATE OF BIRTH:  07/20/1955   DATE OF CONSULTATION:  04/09/2007  DATE OF DISCHARGE:                                 CONSULTATION   REFERRING PHYSICIAN:  Dr. Rory Percy.   REASON FOR CONSULTATION:  Bilateral pulmonary opacities with right  pleural space hydropneumothorax.   CLINICAL HISTORY:  I was asked by Dr. Tyson Alias to evaluate this 55-year-  old white male with a previous smoking history who reports a 2-year  history of shortness of breath with exertion, and recurrent pneumonia  treated in [place].  He said prior to this he was very vigorous and  working, but for the past 2 years has been debilitated due to shortness  of breath.  He apparently was admitted to [facility] in late  October of this year with worsening shortness of breath and fever and  was diagnosed with a pulmonary embolism in the right lower lobe  pulmonary artery.  He was also found be in atrial fibrillation with  rapid ventricular response, as well as diagnosed with probable  pneumonia.  He was admitted and treated with antibiotics and started on  Coumadin.  He was discharged on Coumadin, feeling better, and said that  he did well at home for several days, but then began developing  increasing shortness of breath as well as cough and hemoptysis.  He was  readmitted to [facility] on April 07, 2007, with severe  dyspnea and hemoptysis as well as right-sided pleuritic chest pain.  He  underwent a CT scan of the chest after chest x-ray showed a right chest  opacity.  The CT scan of the chest showed probable necrosis of the right  lower lobe of the lung with a right pleural process, had air and fluid  within it, that likely has probably a hydropneumothorax or  hemopneumothorax.   There are also bilateral diffuse pulmonary opacities  suggesting interstitial disease.  He was transferred to [facility] for  further evaluation.   PAST MEDICAL HISTORY SIGNIFICANT FOR:  1. COPD.  2. History of lung nodules which have been biopsied in the past and      reportedly had been benign.  3. He has a recent history of atrial fibrillation with rapid      ventricular response as noted above.  4. He has history of recent pulmonary embolism with pulmonary infarct.      There were no venous thromboses identified.  5. He has a history of recurrent episodes of pneumonia over the past 2      years.  6. He has history of rheumatic fever in the past that was never      treated.   REVIEW OF SYSTEMS:  As noted on his admission history and physical.  GENERAL:  He denies any recent fever or chills.  He denies any weight  changes.  He has  had a fairly good appetite.  He has had marked fatigue  for the past 2 years.  EYES:  Negative.  ENT:  Negative.  ENDOCRINE:  He  denies diabetes and hypothyroidism.  CARDIOVASCULAR:  He denies any left-  sided chest pain.  He has had recent right-sided flank and pleuritic  chest pain.  He has had marked dyspnea at rest and with exertion.  He  has orthopnea.  He denies PND.  He denies peripheral edema.  He has had  palpitations.  RESPIRATORY:  He reports a cough with hemoptysis.  This  has been going on for about 2 years and said that initially it was blood  streaked sputum, but since discharge in early November on Coumadin, he  has had large amounts of hemoptysis.  GI:  He denies nausea or vomiting.  He denies melena and bright red blood per rectum.  GU:  He denies  dysuria and hematuria.  MUSCULOSKELETAL:  He denies arthralgias and  myalgias.  NEUROLOGICAL:  He denies any focal weakness or numbness.  He  denies dizziness and syncope.  HEMATOLOGICAL:  He denies any history of  easy bleeding or bleeding disorders.   SOCIAL HISTORY:  He is an ex-smoker  but quit about 3 years ago.  He  denies alcohol use but does have a remote history of polysubstance  abuse.  He has been unable to work for the past 2 years.   FAMILY HISTORY:  His mother died of unknown cancer.  There is a history  of heart disease in his family.   PHYSICAL EXAMINATION:  VITAL SIGNS:  Blood pressure is 98/68 with his  pulse of 120 and irregular.  Respiratory rate is 24 and labored.  He is  on 100% nonrebreather with oxygen saturation of 98%.  GENERAL:  He is a mildly obese white male in mild respiratory distress.  HEENT:  Exam shows to be normocephalic and atraumatic.  Pupils are equal  and reactive to light and accommodation.  Extraocular muscles are  intact.  His throat is clear.  NECK:  Exam shows JVD.  Carotid pulses are palpable bilaterally.  There  are no bruits.  There is no adenopathy or thyromegaly.  CARDIAC:  Exam shows an irregular rate and rhythm with normal S1 and S2.  There is no murmur, rub or gallop.  LUNGS:  Reveal bilateral fine rales.  There are decreased breath sounds  in the right lower lobe.  ABDOMINAL:  Exam shows active bowel sounds.  ABDOMEN:  Soft, obese, nontender.  No palpable masses or organomegaly.  EXTREMITIES:  Exam shows no peripheral edema.  Pedal pulses are palpable  bilaterally.  SKIN:  Warm and dry.  NEUROLOGIC:  Exam shows him to be alert and oriented x3.  Motor and  sensory exams are grossly normal.   IMPRESSION:  [name] has presented with a 2-year history of progressive  decline with dyspnea, fatigue, and generally feeling poorly.  This has  been contributed to recurrent episodes of pneumonia.  He now has  suffered a right lower lobe pulmonary embolus which has occluded his  right lower lobe pulmonary artery.  It appears on CT scan that he has  necrosis of his right lower lobe, probably with subsequent breakdown and  bronchopleural fistula resulting in a right hydropneumothorax or  hemopneumothorax.  I suspect that his  hemoptysis is due to the necrosis  of his right lower lobe as well as being on Coumadin.  He does give a  history of blood streaked sputum over the past 2 years, but significant  hemoptysis just started since his embolism.  I reviewed his CT scan with  Dr. Edwyna Shell and we would recommend repeating a CT scan before making any  decision about whether to intervene on the right chest process.  He also  has a diagnosis of moderate mitral stenosis made by 2-D echocardiogram  at [facility] within the past month.  This was felt to be of  rheumatic origin.  Certainly, rheumatic mitral stenosis can give you  shortness of breath, and if severe enough, hemoptysis and pulmonary  hemorrhage, but again I think his pulmonary hemorrhage is more likely  due to his right lower lobe pulmonary embolism and necrosis.  His  diffuse bilateral pulmonary opacities could be due to a primary  pulmonary inflammatory process or could be related to aspiration of some  blood from his hemoptysis or could be related to severe mitral stenosis.  The pattern appears too diffuse and evenly distributed to be related to  aspiration of blood.   RECOMMENDATIONS:  1. I would proceed with placement of an IVC filter to prevent further      pulmonary embolism so that he can remain off anticoagulation.  The      etiology of his embolus is unclear.  2. I would proceed with transesophageal echocardiogram to further      evaluate his degree of mitral valve stenosis.  3. I would repeat CT scan to re-evaluate the right pleural space      process to decide if any surgical intervention is needed for this.      As long as he remains afebrile with a normal white blood cell count      and not showing signs of developing an empyema, I would favor      leaving this alone at this time since intervention may result in      need for a right lower lobectomy under adverse circumstances which      would have a very high morbidity and mortality       of this patient.  4. Depending on the results of his transesophageal echocardiogram and      repeat CT scan, he may require an open lung biopsy to rule out some      primary pulmonary inflammatory process.      Evelene Croon, M.D.  Electronically Signed     BB/MEDQ  D:  04/10/2007  T:  04/11/2007  Job:  [id number]   cc:   Evelene Croon, M.D.

## 2010-09-27 NOTE — Letter (Signed)
November 21, 2007   Charlcie Cradle. Delford Field, MD, FCCP  [address]  [place], [place] [address]   Re:  George, Rogers              DOB:  1955/12/28   Dear [name],   I saw the patient back in today, and his chest x-ray continues to  improve with a marked decrease in the effusion and just scarring.  His  blood pressure is 136/85, pulse 78, respirations 18, and sats were 97%.  His lungs were clear to auscultation and percussion.  He is stable from  a medical standpoint, still having some chest pain, post-thoracotomy  pain, but this has improved slightly.  We will plan to see him back in 4  months with another chest x-ray.   Ines Bloomer, M.D.  Electronically Signed   DPB/MEDQ  D:  11/21/2007  T:  11/22/2007  Job:  [id number]

## 2010-09-27 NOTE — Op Note (Signed)
[name], [name]                  ACCOUNT NO.:  [id number]   MEDICAL RECORD NO.:  [id number]          PATIENT TYPE:  INP   LOCATION:  2550                         FACILITY:  [facility]   PHYSICIAN:  Ines Bloomer, M.D. DATE OF BIRTH:  April 03, 1956   DATE OF PROCEDURE:  04/15/2007  DATE OF DISCHARGE:                               OPERATIVE REPORT   PREOPERATIVE DIAGNOSIS:  Infarcted right lower lobe, interstitial lung  disease.   POSTOPERATIVE DIAGNOSIS:  Infarcted right lower lobe, interstitial lung  disease.   OPERATION:  Right thoracotomy, right lower lobectomy, with decortication  of right middle lobe, right lower lobe.   SURGEON:  Ines Bloomer, M.D.   FIRST ASSISTANT:  Rowe Clack, P.A.-C.   ANESTHESIA:  General anesthesia.   After percutaneous insertion of all monitoring lines, the patient  underwent general anesthesia and was prepped and draped in the usual  sterile manner.  He was turned to the right lateral thoracotomy  position.  A posterolateral thoracotomy was done and a dual-lumen tube  inserted.  The sixth intercostal space was entered, and we took the  intercostal muscle down on the sixth intercostal space disease to use as  a patch.  We then cut a portion of his fifth rib and inserted  Finochietto retractor.  There was a marked amount of blood and  inflammatory tissue of the lower lobe, and the right lower lobe was  completely necrotic.  We slowly debrided this all out, debriding it off  the lateral chest wall neatly and bringing the upper lobe, middle lobe,  and lower lobe off the chest wall.  There was a thick peel on the middle  lobe and the lower lobe, and we could not tell where the fissure was, so  we started decorticating the upper lobe and stripping off the thickened  pleura with sharp and blunt dissection using KDs.  A tear in the lung  was sutured with 3-0 Vicryl.  After we decorticated the upper lobe, we  did the middle lobe and identified the  minor fissure, and then dissected  down the minor fissure to the major fissure and identified this, and  then dissected out the major fissure.  The right lower lobe was then  dissected off the diaphragm, and the inferior pulmonary ligament was  taken up which was partially thickened, until we identified the inferior  pole vein which was glued to the vascular tape. We then the partially  divided the major fissure superiorly and inferiorly with the EZ 45  staplers, and then divided the inferior pulmonary vein with the  Autosuture 45 gray stapler.  We then decided to ligate the bronchus with  a TX60 stapler, which we did, dividing it and removing the right lower  lobe, and oversewed the bronchus with the interrupted 4-0 Prolene, and  then put the muscle flap onto the bronchus and sutured it to the lung  with 4-0 fluoro Prolene.  Several more tears of the lung were sutured  with 3-0 Vicryl.  CoSeal was applied to the staple line.  A right-angle  and a straight chest tube were inserted, #36, as two separate stab  wounds.  A Marcaine block was done in  the usual fashion.  A single On-Q was inserted in the usual fashion.  The patient was closed with 5 pericostals, #1 Vicryl in the muscle  layer, 2-0 Vicryl in the subcutaneous tissue, and Ethicon skin clips.  The patient was returned to the ICU in serious condition.      Ines Bloomer, M.D.  Electronically Signed     DPB/MEDQ  D:  04/15/2007  T:  04/16/2007  Job:  [id number]

## 2010-09-30 NOTE — Discharge Summary (Signed)
[name], [name] NO.:  [id number]   MEDICAL RECORD NO.:  [id number]          PATIENT TYPE:  INP   LOCATION:  2029                         FACILITY:  [facility]   PHYSICIAN:  Charlcie Cradle. Delford Field, MD, FCCPDATE OF BIRTH:  [birth year]   DATE OF ADMISSION:  04/08/2007  DATE OF DISCHARGE:  05/18/2007                               DISCHARGE SUMMARY   DISCHARGE DIAGNOSES:  1. Chronic obstructive pulmonary disease with pulmonary embolism,      pleural effusion, and empyema status post thoracotomy by Dr.      Karle Plumber with placement of tracheostomy, again by Dr. Edwyna Shell.  2. Delirium.  3. Atrial fibrillation with valvular stenosis, mitral and congestive      heart failure.  4. Leukocytosis.  5. Hyperglycemia.   HISTORY OF PRESENT ILLNESS:  [name] is a 55 year old male with a  history of PE with pulmonary infarct on February 22, 2007 with recurrent  pneumonia. He was transferred from [facility] on April 08, 2007 with hemoptysis. He developed necrosis of the right lower lung  with bronchopleural effusion, fistula, and empyema and  hydropneumothorax. He had an IVC filter place on April 10, 2007. He  had a right thoracotomy on April 15, 2007 by Dr. Karle Plumber with a  right lower lobectomy, pleurectomy, and a lung biopsy.   PAST MEDICAL HISTORY:  1. Chronic obstructive pulmonary disease.  2. Atrial fibrillation.  3. Mild to moderate mitral stenosis.  4. Rheumatic fever.  5. Diffuse pulmonary nodules.  6. Diastolic dysfunction.   PAST SURGICAL HISTORY:  1. Right lower lobectomy and pleurectomy with chest tube placement on      April 15, 2007.  2. He had a left upper extremity PIC placed on April 21, 2007 to      May 10, 2007.   MEDICATIONS:  Antibiotics consisted of Ancef, Zosyn, cefuroxime. He also  has been on proton pump inhibitors. He has an IVC filter placed and  treated with Lovenox.   DIAGNOSTIC STUDIES:  His  sputum grew e-coli, resistant to ampicillin,  Unasyn and Ciprofloxacin. Sputum showed e-coli. Blood cultures x2  demonstrated coag negative staph. Left thoracentesis on May 01, 2007 was negative. Sputum culture on May 10, 2007 showed e-coli.  PIC tip on May 10, 2007 was negative. A TEE on April 10, 2007  showed ejection fraction of 50% with mild to moderate stenosis, mild to  moderate tricuspid regurgitation.   LABORATORY DATA:  Sodium 135, potassium 4.2, chloride 101, bicarb 26,  glucose 98, BUN 13, creatinine 0.87, calcium 8.6, total protein 8.1,  SGOT is 20, SGPT is 15, total bilirubin 0.7, alkaline phosphatase 97.   HOSPITAL COURSE BY DISCHARGE DIAGNOSES:  1. Status post vent dependent respiratory failure, requiring oral      tracheal intubation, tracheostomy, and subsequent decannulation. He      was admitted to [facility] on April 08, 2007. He      underwent a right lower lobectomy and pleurectomy with a chest tube      placement per Dr. Karle Plumber.  He was successfully ready to      mechanical support x2 and then subsequently required re-intubation      with a tracheostomy placement by Dr. Karle Plumber. He was      successfully decannulated from tracheostomy and was discharged home      with a healing tracheostomy scar.  2. Chronic obstructive pulmonary disease:  He remains on his current      medications.  3. Pleural effusion, which was drained.  4. Empyema, which is status post thoracotomy.  5. Delirium, which is improved and resolved prior to discharge.  6. Atrial fibrillation, with valvular stenosis, mitral and congestive      heart failure. He had an EP evaluation with Coumadin restarted.  7. Leukocytosis, improved after his infectious processes were treated      with appropriate antibiotics.  8. Hyperglycemia, which was treated with sliding scale insulin and      resolved prior to discharge.   DISCHARGE MEDICATIONS:  1. Coumadin 5 mg  1/2 tablet daily.  2. Xopenex MDI 2 sprays 4 times daily.  3. Flovent 220 2 puffs daily.  4. Cardizem 240 mg daily.  5. Metoprolol 50 mg twice daily.  6. Protonix 40 mg daily.  7. Mucinex 600 mg twice daily.   FOLLOWUP:  1. He is to followup with is primary care physician to have his      Coumadin checked, though he has been non-compliant with this in the      past. When he presented to [facility], he had an INR      of 11.2.  2. He is to followup with Dr. Saddie Benders in [place], [place] at [phone]      [phone].  3. He is to followup with Dr. Manson Passey at the [facility], number [phone]      [phone].  Those appointments were made for him prior to his discharge.   DIET:  Is as instructed, low-sodium, heart healthy.   CONDITION ON DISCHARGE:  Improved.      [name], MSN, ACNP      Charlcie Cradle. Delford Field, MD, [facility]  Electronically Signed    SM/MEDQ  D:  07/16/2007  T:  07/16/2007  Job:  [id number]

## 2010-09-30 NOTE — Consult Note (Signed)
[name], BRAWLEY NO.:  [id number]   MEDICAL RECORD NO.:  [id number]          PATIENT TYPE:  INP   LOCATION:  2111                         FACILITY:  [facility]   PHYSICIAN:  Doylene Canning. Ladona Ridgel, MD    DATE OF BIRTH:  [birth year]   DATE OF CONSULTATION:  03/11/2007  DATE OF DISCHARGE:                                 CONSULTATION   TYPE OF CONSULT:  Cardiology.   HISTORY:  Mr. Whidbee is a 55 year old white male who was transferred from  [facility] on April 08, 2007, after several admissions due to  pneumonia and pulmonary complications.  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  During that admission he underwent a bronchoscopy with biopsy and  results are unknown.  Echocardiogram during that admission showed an EF  of 50%, moderate MS, mild MR and left atrial enlargement and findings  consistent with rheumatic heart disease.  He was discharged home with  anticoagulation on the 12th, however, post discharge, he developed  increasing shortness of breath as well as significant hemoptysis.  He  represented to [facility] on April 07, 2007, where his pO2 was  56 on room air.  He was also complaining of pleuritic chest discomfort.  While hospitalized at [facility] they did do lower extremity  Dopplers, did not show any evidence of a DVT.  However, with continuing  pulmonary treatment of probably worsening pneumonia, cardiology  consultation was obtained with Dr. Tomie China for possible transesophageal  echocardiogram.  Dr. Tomie China felt that he should be transferred to a  tertiary hospital once he arrived at [facility] at April 08, 2007.   In his [facility] admission, he is continued on antibiotics for exacerbation  of pneumonia with multiple x-rays and CT scans, it was felt that empyema  with recurrent pneumonia that his  precipitated his hemoptysis. Since his  admission his Coumadin has been held.  He has also undergone IVP filter  placement on April 10, 2007.  Dr. Gala Romney performed a TEE  on  April 10, 2007.  During this admission he has remained in atrial  fibrillation. Initially his ventricular rate was in the 130s, however,  over the last several days it has remained controlled.  We were asked to  consult to advise on atrial fibrillation treatment.   Since his admission, Mr. Wombles states that he remains short of breath  with minimal activity and his hemoptysis, he states, has improved  slightly.   ALLERGIES:  NO KNOWN DRUG ALLERGIES.   MEDICATIONS:  Prior to admission include:  1. Flovent b.i.d.  2. Lisinopril 10 daily.  3. Metoprolol 25 b.i.d.  4. Coumadin 6 mg daily.  5. Lactinex 2 b.i.d.   PAST MEDICAL HISTORY:  1. Notable for hypertension.  2. Diagnosis of atrial fibrillation in October 2008.  Anticoagulation      has been regulated by his primary care physician.  3. Rheumatic fever as a child at approximately age 60 with  hospitalization at [facility].  4. He has known history of COPD with bilateral pulmonary nodules      which, according to the patient, for quite awhile.  These have been      unremarkable.  He specifically denies any history of myocardial      infarction, diabetes, CVA.  He does not know his cholesterol      status.  He has not had any surgeries.   SOCIAL HISTORY:  He resides in [place] with a roommate in a camper.  He  is currently separated from his wife.  He does not have any children.  He has been unemployed for 2 years on [employer].  He has not smoked  in several weeks.  Does have a 33-year history.  He has no had any  alcohol or illicit drugs in over 3 years.  He denies any herbal  medications, specific diet and he is unable to exercise due to his  condition.   FAMILY HISTORY:  His mother died at the age of 1 with some type of   cancer.  His father died at the age of 73 with history of hypertension,  CVA, and myocardial infarction.  One sister is alive and well unsure of  her health.  One brother is deceased from a blood clot in the brain.   REVIEW OF SYSTEMS:  In addition to the above is notable for chills,  dentures, orthopnea, PND, dyspnea on exertion, shortness of breath,  resolution of edema since his last hospitalization, occasional  palpitations, wheezing, GERD, and constipation while here, noted to be  unremarkable.   PHYSICAL EXAMINATION:  GENERAL:  Well-nourished, well developed pleasant  white male in no apparent distress.  His wife is present.  VITAL SIGNS:  Temperature 97.6, blood pressure 106/57, pulse 76,  respirations 24, telemetry shows atrial fibrillation with ventricular  rate in the 70s and 80s.  He is on 100% nonrebreather 100% saturations  on a 6% nonrebreather.  I and Os while hospitalized have been 7826 and  78295, weight has not been documented.  HEENT:  Unremarkable except for dentures.  NECK:  Supple without thyromegaly, adenopathy, JVD or carotid bruits.  CHEST:  Symmetrical excursion, lung sounds are diminished particularly  in the bases.  He does have crackles in both bases, right greater than  left.  I do not appreciate any rhonchi or wheezing. Current PMI is not  displayed.  He has irregular rhythm, variable S1, normal S2.  I also  appreciate a 1/6 systolic murmur that is appreciated at the left sternal  border. All pulses are symmetrical and intact.  I do not appreciate any  abdominal or femoral bruit.  SKIN/INTEGUMENT:  Also intact.  ABDOMEN:  Obese, bowel sounds present without organomegaly, masses or  tenderness.  EXTREMITIES:  Negative clubbing, cyanosis or edema.  MUSCULOSKELETAL:  Grossly unremarkable.  NEUROLOGIC:  Grossly unremarkable.   X-RAYS:  Chest x-ray on admission showed bilateral air-space disease  with right unipolar consolidation suggestive of infarct.   April 09, 2007, chest CT showed proximal empyema with bilateral loculated pleural  effusion.  EKG showed atrial fibrillation with a ventricular rate of  135,  nonspecific ST-T wave changes.  Old EKGs are not available for  comparison.  On admission H&H was 9.3 and 27.5, normal indices,  platelets 523, WBCs 10.3, sodium 136, potassium 4.0, BUN 15, creatinine  0.79, glucose 111, normal LFTs.  BNP on admission 458, however, today is  86.  On admission  PTT was 32, PT 19.0, INR 1.6, CK-MB and troponins were  within normal limits.  PT INR yesterday was 1.7 and today BUN and  creatinine 20 and 0.95, platelets 87 and potassium 3.7.  Sputum cultures  have been inadequate times 2.  Respiratory cultures have shown normal  flora.   IMPRESSION:  Congestive heart failure, acute on-chronic, secondary to  diastolic dysfunction, atrial fibrillation with rapid ventricular rate,  now appears to be controlled.  Rheumatic disease with moderate MS, mild  mitral regurgitation, mild tricuspid regurgitation, long-standing lung  disease with hemoptysis.  History as per past medical history.   DISPOSITION:  Dr. Ladona Ridgel reviewed the patient's history successfully and  examined the patient and agrees with the above.   RECOMMENDATIONS:  Initial recommendation is rate control with AV nodal  blocking drugs as you are current doing as well as diuresis to keep his  BNP less than 100.  Coumadin needs to be long standing, once lung  disease is stabilized, given his atrial fibrillation, pulmonary embolism  and rheumatic heart disease.  However, Dr. Ladona Ridgel feels the real  question is do we know for  certain that he does not have tuberculosis.  He has been sick with fever, chills and shortness of breath an recurrent  episodes of pneumonia and bronchitis for 3 years, requiring 6  hospitalizations and always gets better with antibiotics.  Dr. Ladona Ridgel  agrees with aggressive surgical of underlying lung processes.  His   mitral stenosis is not severe enough by TEE to cause alveolar hemorrhage  which is appearing in the setting of severe pulmonary hypertension.      Joellyn Rued, PA-C      Doylene Canning. Ladona Ridgel, MD  Electronically Signed    EW/MEDQ  D:  04/11/2007  T:  04/12/2007  Job:  [id number]   cc:   Juline Patch MD  Manson Passey, MD  Wille Glaser Chodri, M.D.

## 2010-10-14 ENCOUNTER — Ambulatory Visit: Payer: Medicaid Other | Admitting: Critical Care Medicine

## 2011-02-02 LAB — CBC
HCT: 38.3 — ABNORMAL LOW
Hemoglobin: 12.7 — ABNORMAL LOW
MCHC: 33.1
RBC: 4.3
RDW: 16 — ABNORMAL HIGH

## 2011-02-02 LAB — BASIC METABOLIC PANEL
CO2: 25
Glucose, Bld: 91
Potassium: 3.9
Sodium: 134 — ABNORMAL LOW

## 2011-02-02 LAB — PROTIME-INR
INR: 2.8 — ABNORMAL HIGH
Prothrombin Time: 30.1 — ABNORMAL HIGH
Prothrombin Time: 31 — ABNORMAL HIGH

## 2011-02-02 LAB — B-NATRIURETIC PEPTIDE (CONVERTED LAB): Pro B Natriuretic peptide (BNP): 213 — ABNORMAL HIGH

## 2011-02-17 LAB — RETICULOCYTES
RBC.: 3.17 — ABNORMAL LOW
RBC.: 3.52 — ABNORMAL LOW
Retic Count, Absolute: 42.2
Retic Ct Pct: 1.3

## 2011-02-17 LAB — BODY FLUID CELL COUNT WITH DIFFERENTIAL
Eos, Fluid: 2
Lymphs, Fluid: 68
Neutrophil Count, Fluid: 14
Total Nucleated Cell Count, Fluid: 560

## 2011-02-17 LAB — BASIC METABOLIC PANEL
BUN: 11
BUN: 12
BUN: 12
BUN: 15
BUN: 17
BUN: 19
BUN: 19
CO2: 29
CO2: 30
CO2: 31
Calcium: 8.3 — ABNORMAL LOW
Calcium: 8.5
Calcium: 8.7
Calcium: 8.8
Chloride: 100
Chloride: 100
Chloride: 101
Chloride: 102
Chloride: 104
Creatinine, Ser: 0.64
Creatinine, Ser: 0.92
GFR calc Af Amer: >60
GFR calc Af Amer: >60
GFR calc Af Amer: >60
GFR calc non Af Amer: >60
GFR calc non Af Amer: >60
GFR calc non Af Amer: >60
GFR calc non Af Amer: >60
GFR calc non Af Amer: >60
GFR calc non Af Amer: >60
GFR calc non Af Amer: >60
GFR calc non Af Amer: >60
Glucose, Bld: 118 — ABNORMAL HIGH
Glucose, Bld: 132 — ABNORMAL HIGH
Glucose, Bld: 141 — ABNORMAL HIGH
Glucose, Bld: 149 — ABNORMAL HIGH
Potassium: 3.4 — ABNORMAL LOW
Potassium: 3.8
Potassium: 4.1
Potassium: 4.1
Potassium: 4.2
Potassium: 4.2
Potassium: 4.2
Sodium: 134 — ABNORMAL LOW
Sodium: 135
Sodium: 137
Sodium: 137
Sodium: 138
Sodium: 140

## 2011-02-17 LAB — PH, BODY FLUID

## 2011-02-17 LAB — COMPREHENSIVE METABOLIC PANEL
ALT: 13
ALT: 40
AST: 34
AST: 51 — ABNORMAL HIGH
Albumin: 1.6 — ABNORMAL LOW
Albumin: 1.7 — ABNORMAL LOW
Alkaline Phosphatase: 121 — ABNORMAL HIGH
Alkaline Phosphatase: 78
Alkaline Phosphatase: 97
Alkaline Phosphatase: 99
BUN: 11
BUN: 13
BUN: 14
CO2: 26
CO2: 30
Chloride: 100
Chloride: 101
Chloride: 102
Chloride: 99
Creatinine, Ser: 0.8
GFR calc Af Amer: >60
GFR calc Af Amer: >60
GFR calc non Af Amer: >60
GFR calc non Af Amer: >60
GFR calc non Af Amer: >60
Glucose, Bld: 135 — ABNORMAL HIGH
Glucose, Bld: 141 — ABNORMAL HIGH
Glucose, Bld: 91
Glucose, Bld: 98
Potassium: 3.5
Potassium: 4
Potassium: 4.2
Potassium: 4.3
Potassium: 4.9
Sodium: 138
Sodium: 138
Sodium: 140
Total Bilirubin: 0.3
Total Bilirubin: 0.7
Total Bilirubin: 1
Total Bilirubin: 1.4 — ABNORMAL HIGH
Total Protein: 6.6
Total Protein: 7.3

## 2011-02-17 LAB — CBC
HCT: 29 — ABNORMAL LOW
HCT: 30.1 — ABNORMAL LOW
HCT: 30.4 — ABNORMAL LOW
HCT: 32.1 — ABNORMAL LOW
HCT: 35.6 — ABNORMAL LOW
HCT: 37.8 — ABNORMAL LOW
Hemoglobin: 10.1 — ABNORMAL LOW
Hemoglobin: 10.4 — ABNORMAL LOW
Hemoglobin: 10.7 — ABNORMAL LOW
Hemoglobin: 12.3 — ABNORMAL LOW
Hemoglobin: 12.5 — ABNORMAL LOW
Hemoglobin: 7.9 — CL
Hemoglobin: 9.8 — ABNORMAL LOW
MCHC: 32.7
MCHC: 32.8
MCV: 88.8
MCV: 89
MCV: 89.8
MCV: 90.6
Platelets: 203
Platelets: 203
Platelets: 225
Platelets: 266
Platelets: 277
Platelets: 302
Platelets: 340
Platelets: 346
RBC: 3.25 — ABNORMAL LOW
RBC: 3.35 — ABNORMAL LOW
RBC: 3.58 — ABNORMAL LOW
RBC: 3.89 — ABNORMAL LOW
RDW: 14.7
RDW: 14.9
RDW: 15.2
RDW: 15.2
RDW: 15.2
RDW: 16.2 — ABNORMAL HIGH
WBC: 22.4 — ABNORMAL HIGH
WBC: 6.9
WBC: 8.2
WBC: 8.3
WBC: 8.3
WBC: 8.9
WBC: 9.1
WBC: 9.4

## 2011-02-17 LAB — FOLATE
Folate: 12.1
Folate: 14.1

## 2011-02-17 LAB — PROTIME-INR: INR: 1.1

## 2011-02-17 LAB — DIFFERENTIAL
Eosinophils Absolute: 0.2
Eosinophils Relative: 3
Lymphocytes Relative: 15
Lymphs Abs: 1
Monocytes Absolute: 0.9
Monocytes Relative: 13 — ABNORMAL HIGH

## 2011-02-17 LAB — BODY FLUID CULTURE
Culture: NO GROWTH
Gram Stain: NONE SEEN

## 2011-02-17 LAB — CULTURE, RESPIRATORY W GRAM STAIN

## 2011-02-17 LAB — PROTEIN, BODY FLUID: Total protein, fluid: <3

## 2011-02-17 LAB — FERRITIN: Ferritin: 673 — ABNORMAL HIGH (ref 22–322)

## 2011-02-17 LAB — CROSSMATCH

## 2011-02-17 LAB — PREALBUMIN
Prealbumin: 10.1 — ABNORMAL LOW
Prealbumin: 11.1 — ABNORMAL LOW
Prealbumin: 6.6 — ABNORMAL LOW

## 2011-02-17 LAB — IRON AND TIBC
Saturation Ratios: 11 — ABNORMAL LOW
Saturation Ratios: 8 — ABNORMAL LOW
UIBC: 152
UIBC: 164

## 2011-02-17 LAB — CATH TIP CULTURE: Culture: NO GROWTH

## 2011-02-17 LAB — B-NATRIURETIC PEPTIDE (CONVERTED LAB)
Pro B Natriuretic peptide (BNP): 162 — ABNORMAL HIGH
Pro B Natriuretic peptide (BNP): 163 — ABNORMAL HIGH
Pro B Natriuretic peptide (BNP): 76
Pro B Natriuretic peptide (BNP): 92

## 2011-02-17 LAB — LACTATE DEHYDROGENASE: LDH: 403 — ABNORMAL HIGH

## 2011-02-20 LAB — PROTIME-INR
INR: 1.3
Prothrombin Time: 16.9 — ABNORMAL HIGH

## 2011-02-20 LAB — POCT I-STAT 3, ART BLOOD GAS (G3+)
Acid-Base Excess: 1
Acid-Base Excess: 2
Acid-Base Excess: 3 — ABNORMAL HIGH
Acid-base deficit: 1
Acid-base deficit: 1
Bicarbonate: 23.5
Bicarbonate: 24.5 — ABNORMAL HIGH
Bicarbonate: 25.3 — ABNORMAL HIGH
Bicarbonate: 25.5 — ABNORMAL HIGH
Bicarbonate: 27.5 — ABNORMAL HIGH
Bicarbonate: 27.8 — ABNORMAL HIGH
Bicarbonate: 31.1 — ABNORMAL HIGH
O2 Saturation: 84
O2 Saturation: 93
O2 Saturation: 95
O2 Saturation: 98
Operator id: 196291
Operator id: 265961
Operator id: 297351
Operator id: 297551
Operator id: 297641
Patient temperature: 100.1
Patient temperature: 97.3
Patient temperature: 99.7
TCO2: 25
TCO2: 25
TCO2: 29
TCO2: 30
TCO2: 32
TCO2: 33
TCO2: 33
pCO2 arterial: 39
pCO2 arterial: 39.5
pCO2 arterial: 40
pCO2 arterial: 42
pCO2 arterial: 45.3 — ABNORMAL HIGH
pH, Arterial: 7.227 — ABNORMAL LOW
pH, Arterial: 7.474 — ABNORMAL HIGH
pH, Arterial: 7.487 — ABNORMAL HIGH
pH, Arterial: 7.491 — ABNORMAL HIGH
pH, Arterial: 7.497 — ABNORMAL HIGH
pH, Arterial: 7.5 — ABNORMAL HIGH
pO2, Arterial: 105 — ABNORMAL HIGH
pO2, Arterial: 65 — ABNORMAL LOW
pO2, Arterial: 65 — ABNORMAL LOW
pO2, Arterial: 71 — ABNORMAL LOW
pO2, Arterial: 78 — ABNORMAL LOW
pO2, Arterial: 79 — ABNORMAL LOW
pO2, Arterial: 80

## 2011-02-20 LAB — CULTURE, BAL-QUANTITATIVE W GRAM STAIN: Culture: NO GROWTH

## 2011-02-20 LAB — HEMOGLOBIN AND HEMATOCRIT, BLOOD
HCT: 23.6 — ABNORMAL LOW
HCT: 23.7 — ABNORMAL LOW
HCT: 25.2 — ABNORMAL LOW
Hemoglobin: 7.9 — CL
Hemoglobin: 8.4 — ABNORMAL LOW

## 2011-02-20 LAB — CULTURE, RESPIRATORY W GRAM STAIN: Culture: NO GROWTH

## 2011-02-20 LAB — VITAMIN B12: Vitamin B-12: 309 (ref 211–911)

## 2011-02-20 LAB — URINE CULTURE
Colony Count: NO GROWTH
Culture: NO GROWTH
Special Requests: NEGATIVE
Special Requests: POSITIVE

## 2011-02-20 LAB — CBC
HCT: 21.3 — ABNORMAL LOW
HCT: 25.3 — ABNORMAL LOW
HCT: 26.1 — ABNORMAL LOW
HCT: 26.6 — ABNORMAL LOW
HCT: 32.4 — ABNORMAL LOW
Hemoglobin: 10.9 — ABNORMAL LOW
Hemoglobin: 7.2 — CL
Hemoglobin: 8.3 — ABNORMAL LOW
Hemoglobin: 8.5 — ABNORMAL LOW
Hemoglobin: 8.9 — ABNORMAL LOW
Hemoglobin: 8.9 — ABNORMAL LOW
Hemoglobin: 9 — ABNORMAL LOW
Hemoglobin: 9.1 — ABNORMAL LOW
Hemoglobin: 9.2 — ABNORMAL LOW
Hemoglobin: 9.4 — ABNORMAL LOW
MCHC: 33.7
MCHC: 33.7
MCHC: 33.7
MCHC: 34.1
MCHC: 34.1
MCHC: 34.2
MCV: 89.1
MCV: 89.6
MCV: 90.5
MCV: 90.9
MCV: 91.2
MCV: 91.3
MCV: 91.4
Platelets: 153
Platelets: 283
Platelets: 96 — ABNORMAL LOW
RBC: 2.35 — ABNORMAL LOW
RBC: 2.64 — ABNORMAL LOW
RBC: 2.76 — ABNORMAL LOW
RBC: 2.81 — ABNORMAL LOW
RBC: 2.97 — ABNORMAL LOW
RBC: 2.98 — ABNORMAL LOW
RBC: 3.02 — ABNORMAL LOW
RBC: 3.05 — ABNORMAL LOW
RBC: 3.55 — ABNORMAL LOW
RDW: 14.7
RDW: 14.9
RDW: 15
RDW: 15.1
RDW: 15.2
RDW: 15.4
WBC: 16.9 — ABNORMAL HIGH
WBC: 6.6
WBC: 6.9
WBC: 7.5
WBC: 7.6
WBC: 7.8
WBC: 7.8
WBC: 7.9
WBC: 8.7

## 2011-02-20 LAB — COMPREHENSIVE METABOLIC PANEL
ALT: 25
AST: 30
Alkaline Phosphatase: 59
BUN: 5 — ABNORMAL LOW
CO2: 30
Calcium: 7.8 — ABNORMAL LOW
Chloride: 105
Creatinine, Ser: 0.81
GFR calc Af Amer: >60
GFR calc non Af Amer: >60
Glucose, Bld: 93
Sodium: 141
Total Bilirubin: 0.7
Total Protein: 5.3 — ABNORMAL LOW

## 2011-02-20 LAB — BASIC METABOLIC PANEL
BUN: 13
BUN: 14
CO2: 26
CO2: 29
CO2: 29
CO2: 29
CO2: 30
Calcium: 7.4 — ABNORMAL LOW
Calcium: 7.6 — ABNORMAL LOW
Calcium: 7.9 — ABNORMAL LOW
Calcium: 8 — ABNORMAL LOW
Calcium: 8 — ABNORMAL LOW
Calcium: 8.2 — ABNORMAL LOW
Chloride: 101
Chloride: 103
Chloride: 104
Chloride: 105
Chloride: 106
Chloride: 106
Chloride: 107
Creatinine, Ser: 0.85
Creatinine, Ser: 0.95
Creatinine, Ser: 1.13
Creatinine, Ser: 1.27
Creatinine, Ser: 1.28
GFR calc Af Amer: >60
GFR calc Af Amer: >60
GFR calc Af Amer: >60
GFR calc Af Amer: >60
GFR calc Af Amer: >60
GFR calc Af Amer: >60
GFR calc Af Amer: >60
GFR calc Af Amer: >60
GFR calc non Af Amer: >60
GFR calc non Af Amer: >60
GFR calc non Af Amer: >60
GFR calc non Af Amer: >60
GFR calc non Af Amer: >60
Glucose, Bld: 108 — ABNORMAL HIGH
Glucose, Bld: 138 — ABNORMAL HIGH
Glucose, Bld: 138 — ABNORMAL HIGH
Glucose, Bld: 162 — ABNORMAL HIGH
Potassium: 3.4 — ABNORMAL LOW
Potassium: 3.5
Potassium: 3.7
Potassium: 4
Potassium: 4
Sodium: 138
Sodium: 139
Sodium: 140
Sodium: 140
Sodium: 141

## 2011-02-20 LAB — FUNGUS CULTURE W SMEAR: Fungal Smear: NONE SEEN

## 2011-02-20 LAB — I-STAT 8, (EC8 V) (CONVERTED LAB)
Acid-base deficit: 1
Chloride: 102
Glucose, Bld: 125 — ABNORMAL HIGH
Hemoglobin: 10.5 — ABNORMAL LOW
Potassium: 4.4
Sodium: 136
TCO2: 28
pH, Ven: 7.284

## 2011-02-20 LAB — BODY FLUID CULTURE

## 2011-02-20 LAB — CROSSMATCH
ABO/RH(D): O POS
Antibody Screen: NEGATIVE
Antibody Screen: NEGATIVE

## 2011-02-20 LAB — URINALYSIS, ROUTINE W REFLEX MICROSCOPIC
Ketones, ur: NEGATIVE
Protein, ur: NEGATIVE
Urobilinogen, UA: 0.2

## 2011-02-20 LAB — POCT I-STAT EG7
Calcium, Ion: 1.16
HCT: 31 — ABNORMAL LOW
Hemoglobin: 10.5 — ABNORMAL LOW
O2 Saturation: 81
Operator id: 138551
Sodium: 134 — ABNORMAL LOW
pO2, Ven: 48 — ABNORMAL HIGH

## 2011-02-20 LAB — CULTURE, BLOOD (ROUTINE X 2)
Culture: NO GROWTH
Culture: NO GROWTH

## 2011-02-20 LAB — B-NATRIURETIC PEPTIDE (CONVERTED LAB)
Pro B Natriuretic peptide (BNP): 171 — ABNORMAL HIGH
Pro B Natriuretic peptide (BNP): 193 — ABNORMAL HIGH
Pro B Natriuretic peptide (BNP): 220 — ABNORMAL HIGH
Pro B Natriuretic peptide (BNP): 231 — ABNORMAL HIGH

## 2011-02-20 LAB — URINE MICROSCOPIC-ADD ON

## 2011-02-20 LAB — PREPARE RBC (CROSSMATCH)

## 2011-02-20 LAB — RETICULOCYTES: Retic Ct Pct: 3.2 — ABNORMAL HIGH

## 2011-02-20 LAB — BODY FLUID CELL COUNT WITH DIFFERENTIAL

## 2011-02-20 LAB — IRON AND TIBC: Saturation Ratios: 8 — ABNORMAL LOW

## 2011-02-21 LAB — CARDIAC PANEL(CRET KIN+CKTOT+MB+TROPI)
CK, MB: 4.4 — ABNORMAL HIGH
Relative Index: INVALID
Total CK: 36
Total CK: 49

## 2011-02-21 LAB — CBC
HCT: 23.5 — ABNORMAL LOW
HCT: 27.5 — ABNORMAL LOW
HCT: 27.5 — ABNORMAL LOW
HCT: 28.8 — ABNORMAL LOW
Hemoglobin: 8 — ABNORMAL LOW
Hemoglobin: 9.2 — ABNORMAL LOW
Hemoglobin: 9.3 — ABNORMAL LOW
Hemoglobin: 9.7 — ABNORMAL LOW
MCHC: 33
MCHC: 33.3
MCHC: 33.7
MCHC: 34.2
MCV: 91.7
MCV: 92.6
MCV: 93.2
MCV: 93.5
MCV: 93.7
Platelets: 351
Platelets: 426 — ABNORMAL HIGH
Platelets: 499 — ABNORMAL HIGH
Platelets: 523 — ABNORMAL HIGH
RBC: 2.56 — ABNORMAL LOW
RBC: 2.93 — ABNORMAL LOW
RBC: 2.97 — ABNORMAL LOW
RBC: 3.15 — ABNORMAL LOW
RDW: 13.9
RDW: 14.3
RDW: 14.3
WBC: 10.3
WBC: 10.3
WBC: 9.7

## 2011-02-21 LAB — BASIC METABOLIC PANEL
BUN: 10
BUN: 11
BUN: 12
BUN: 19
CO2: 27
CO2: 28
CO2: 28
CO2: 30
CO2: 33 — ABNORMAL HIGH
Chloride: 101
Chloride: 104
Chloride: 92 — ABNORMAL LOW
Chloride: 98
Chloride: 98
Chloride: 99
Creatinine, Ser: 0.9
Creatinine, Ser: 0.91
Creatinine, Ser: 0.94
GFR calc Af Amer: >60
GFR calc Af Amer: >60
GFR calc non Af Amer: >60
Glucose, Bld: 109 — ABNORMAL HIGH
Glucose, Bld: 110 — ABNORMAL HIGH
Glucose, Bld: 87
Potassium: 3.9
Potassium: 4
Potassium: 4.2
Sodium: 135
Sodium: 141

## 2011-02-21 LAB — POCT I-STAT 3, ART BLOOD GAS (G3+)
Bicarbonate: 29.8 — ABNORMAL HIGH
Operator id: 280981
pCO2 arterial: 39.6
pH, Arterial: 7.484 — ABNORMAL HIGH
pO2, Arterial: 129 — ABNORMAL HIGH

## 2011-02-21 LAB — COMPREHENSIVE METABOLIC PANEL
AST: 20
Albumin: 2.3 — ABNORMAL LOW
BUN: 15
Calcium: 8.3 — ABNORMAL LOW
Creatinine, Ser: 0.79
GFR calc Af Amer: >60
GFR calc non Af Amer: >60

## 2011-02-21 LAB — HEMOGLOBIN AND HEMATOCRIT, BLOOD
HCT: 26.9 — ABNORMAL LOW
Hemoglobin: 8.4 — ABNORMAL LOW
Hemoglobin: 9 — ABNORMAL LOW

## 2011-02-21 LAB — B-NATRIURETIC PEPTIDE (CONVERTED LAB)
Pro B Natriuretic peptide (BNP): 316 — ABNORMAL HIGH
Pro B Natriuretic peptide (BNP): 70

## 2011-02-21 LAB — PROTIME-INR
INR: 1.5
INR: 1.6 — ABNORMAL HIGH
Prothrombin Time: 17.2 — ABNORMAL HIGH
Prothrombin Time: 18.1 — ABNORMAL HIGH
Prothrombin Time: 19 — ABNORMAL HIGH
Prothrombin Time: 20.4 — ABNORMAL HIGH

## 2011-02-21 LAB — ANTI-NEUTROPHIL ANTIBODY: Cytoplasmic Neutrophilic Ab: <1:20 {titer}

## 2011-02-21 LAB — SALICYLATE LEVEL: Salicylate Lvl: <4

## 2011-02-21 LAB — GLOMERULAR BASEMENT MEMBRANE ANTIBODIES: GBM Ab: 0 AU/mL

## 2011-02-21 LAB — APTT: aPTT: 28

## 2011-02-21 LAB — CROSSMATCH
ABO/RH(D): O POS
Antibody Screen: NEGATIVE

## 2011-02-21 LAB — CULTURE, RESPIRATORY W GRAM STAIN

## 2011-02-21 LAB — ABO/RH: ABO/RH(D): O POS

## 2011-02-21 LAB — EXPECTORATED SPUTUM ASSESSMENT W GRAM STAIN, RFLX TO RESP C

## 2011-02-21 LAB — COMPLEMENT, TOTAL: Compl, Total (CH50): 64 U/mL (ref 31–66)

## 2014-12-24 ENCOUNTER — Ambulatory Visit (INDEPENDENT_AMBULATORY_CARE_PROVIDER_SITE_OTHER)
Admission: RE | Admit: 2014-12-24 | Discharge: 2014-12-24 | Disposition: A | Discharge status: Home / Self Care | Payer: Medicaid Other | Source: Ambulatory Visit | Attending: Internal Medicine | Admitting: Internal Medicine

## 2014-12-24 ENCOUNTER — Encounter: Payer: Self-pay | Admitting: Internal Medicine

## 2014-12-24 ENCOUNTER — Ambulatory Visit (INDEPENDENT_AMBULATORY_CARE_PROVIDER_SITE_OTHER): Payer: Medicaid Other | Admitting: Internal Medicine

## 2014-12-24 VITALS — BP 90/60 | HR 75 | Temp 98.3°F | Ht 66.0 in | Wt 183.2 lb

## 2014-12-24 DIAGNOSIS — R9389 Abnormal findings on diagnostic imaging of other specified body structures: Secondary | ICD-10-CM

## 2014-12-24 DIAGNOSIS — J449 Chronic obstructive pulmonary disease, unspecified: Secondary | ICD-10-CM | POA: Diagnosis not present

## 2014-12-24 DIAGNOSIS — R042 Hemoptysis: Secondary | ICD-10-CM | POA: Diagnosis not present

## 2014-12-24 DIAGNOSIS — G8922 Chronic post-thoracotomy pain: Secondary | ICD-10-CM | POA: Diagnosis not present

## 2014-12-24 MED ORDER — AZITHROMYCIN 250 MG PO TABS: ORAL_TABLET | ORAL | Status: DC

## 2014-12-24 MED ORDER — MOMETASONE FURO-FORMOTEROL FUM 200-5 MCG/ACT IN AERO: INHALATION_SPRAY | RESPIRATORY_TRACT | Status: DC

## 2014-12-24 NOTE — Patient Instructions (Addendum)
Stop advair and start dulera 200 Take 2 puffs first thing in am and then another 2 puffs about 12 hours later.   Only use your Proair (Red inhaler) as a rescue medication to be used if you can't catch your breath by resting or doing a relaxed purse lip breathing pattern.  - The less you use it, the better it will work when you need it. - Ok to use up to 2 puffs  every 4 hours if you must but call for immediate appointment if use goes up over your usual need - Don't leave home without it !!  (think of it like the spare tire for your car)   Stop xarelto until bleeding stops then take every other day   zpak   Please remember to go to the x-ray department downstairs for your tests - we will call you with the results when they are available.  Please schedule a follow up office visit in 4 weeks, sooner if needed to see Tammy NP

## 2014-12-24 NOTE — Progress Notes (Signed)
Subjective:    Patient ID: [name], male    DOB: Feb 27, 1956,    MRN: [id number]  HPI  70 yowm completely quit smoking around 2007 s/p RLung surgery followed by [name] in [place]   DATE OF ADMISSION: 04/08/2007 DATE OF DISCHARGE: 05/18/2007   DISCHARGE SUMMARY  DISCHARGE DIAGNOSES: 1. Chronic obstructive pulmonary disease with pulmonary embolism,  pleural effusion, and empyema status post thoracotomy by Dr.  Karle Plumber with placement of tracheostomy, again by Dr. Edwyna Shell. 2. Delirium. 3. Atrial fibrillation with valvular stenosis, mitral and congestive  heart failure. 4. Leukocytosis. 5. Hyperglycemia.  HISTORY OF PRESENT ILLNESS: [name] is a 59 year old male with a history of PE with pulmonary infarct on February 22, 2007 with recurrent pneumonia. He was transferred from [facility] on April 08, 2007 with hemoptysis. He developed necrosis of the right lower lung with bronchopleural effusion, fistula, and empyema and hydropneumothorax. He had an IVC filter place on April 10, 2007. He had a right thoracotomy on April 15, 2007 by Dr. Karle Plumber with a right lower lobectomy, pleurectomy, and a lung biopsy.  PAST MEDICAL HISTORY: 1. Chronic obstructive pulmonary disease. 2. Atrial fibrillation. 3. Mild to moderate mitral stenosis. 4. Rheumatic fever. 5. Diffuse pulmonary nodules. 6. Diastolic dysfunction.  PAST SURGICAL HISTORY: 1. Right lower lobectomy and pleurectomy with chest tube placement on  April 15, 2007. 2. He had a left upper extremity PIC placed on April 21, 2007 to  May 10, 2007.  MEDICATIONS: Antibiotics consisted of Ancef, Zosyn, cefuroxime. He also has been on proton pump inhibitors. He has an IVC filter placed and treated with Lovenox.  DIAGNOSTIC STUDIES: His sputum grew e-coli, resistant to  ampicillin, Unasyn and Ciprofloxacin. Sputum showed e-coli. Blood cultures x2 demonstrated coag negative staph. Left thoracentesis on May 01, 2007 was negative. Sputum culture on May 10, 2007 showed e-coli. PIC tip on May 10, 2007 was negative. A TEE on April 10, 2007 showed ejection fraction of 50% with mild to moderate stenosis, mild to moderate tricuspid regurgitation.  LABORATORY DATA: Sodium 135, potassium 4.2, chloride 101, bicarb 26, glucose 98, BUN 13, creatinine 0.87, calcium 8.6, total protein 8.1, SGOT is 20, SGPT is 15, total bilirubin 0.7, alkaline phosphatase 97.  HOSPITAL COURSE BY DISCHARGE DIAGNOSES: 1. Status post vent dependent respiratory failure, requiring oral  tracheal intubation, tracheostomy, and subsequent decannulation. He  was admitted to [facility] on April 08, 2007. He  underwent a right lower lobectomy and pleurectomy with a chest tube  placement per Dr. Karle Plumber. He was successfully ready to  mechanical support x2 and then subsequently required re-intubation  with a tracheostomy placement by Dr. Karle Plumber. He was  successfully decannulated from tracheostomy and was discharged home  with a healing tracheostomy scar. 2. Chronic obstructive pulmonary disease: He remains on his current  medications. 3. Pleural effusion, which was drained. 4. Empyema, which is status post thoracotomy. 5. Delirium, which is improved and resolved prior to discharge. 6. Atrial fibrillation, with valvular stenosis, mitral and congestive  heart failure. He had an EP evaluation with Coumadin restarted. 7. Leukocytosis, improved after his infectious processes were treated  with appropriate antibiotics. 8. Hyperglycemia, which was treated with sliding scale insulin and  resolved prior to discharge.  DISCHARGE MEDICATIONS: 1. Coumadin 5 mg 1/2 tablet daily. 2.  Xopenex MDI 2 sprays 4 times daily. 3. Flovent 220 2 puffs daily. 4. Cardizem 240 mg daily. 5. Metoprolol 50 mg twice daily. 6. Protonix  40 mg daily. 7. Mucinex 600 mg twice daily.      12/24/2014 1st [place]   office visit/ [name]  / self referral / on chronic daily xarelto (never misses dose)  Chief Complaint  Patient presents with  . Acute Visit    Pt c/o hemoptysis and right side rib pain x 2 days- started after he was digging a ditch. He states his breathing has also been slightly worse.   abrupt 12/22/14 new R lower pleuritic ant cp while digging then  w/in a few minutes noted onset of hemoptysis x sev tbsp Does have a chronic cough with white mucus attributes to copd on advair / occ 02 at bedtime but not during the day' Pain and hemoptysis have persisted since onset but really min sob over baseline = needing saba avg of bid while on advair   No obvious day to day or daytime variability or assoc   chest tightness, subjective wheeze or overt sinus /epistaxis or hb symptoms. No unusual exp hx or h/o childhood pna/ asthma or knowledge of premature birth.  Sleeping ok without nocturnal  or early am exacerbation  of respiratory  c/o's or need for noct saba. Also denies any obvious fluctuation of symptoms with weather or environmental changes or other aggravating or alleviating factors except as outlined above   Current Medications, Allergies, Complete Past Medical History, Past Surgical History, Family History, and Social History were reviewed in [employer] record.  ROS  The following are not active complaints unless bolded sore throat, dysphagia, dental problems, itching, sneezing,  nasal congestion or excess/ purulent secretions, ear ache,   fever, chills, sweats, unintended wt loss, classically  exertional cp, hemoptysis,  orthopnea pnd or leg swelling, presyncope, palpitations, abdominal pain, anorexia, nausea, vomiting, diarrhea  or change in bowel or  bladder habits, change in stools or urine, dysuria,hematuria,  rash, arthralgias, visual complaints, headache, numbness, weakness or ataxia or problems with walking or coordination,  change in mood/affect or memory.           Review of Systems  Constitutional: Negative for fever, chills, activity change, appetite change and unexpected weight change.  HENT: Negative for congestion, dental problem, postnasal drip, rhinorrhea, sneezing, sore throat, trouble swallowing and voice change.   Eyes: Negative for visual disturbance.  Respiratory: Positive for cough and shortness of breath. Negative for choking.   Cardiovascular: Positive for chest pain. Negative for leg swelling.  Gastrointestinal: Negative for nausea, vomiting and abdominal pain.  Genitourinary: Negative for difficulty urinating.  Musculoskeletal: Negative for arthralgias.  Skin: Negative for rash.  Psychiatric/Behavioral: Negative for behavioral problems and confusion.       Objective:   Physical Exam  amb wm nad  Wt Readings from Last 3 Encounters:  12/24/14 183 lb 3.2 oz (83.099 kg)  09/09/10 222 lb (100.699 kg)  09/02/10 222 lb 9.6 oz (100.971 kg)    Vital signs reviewed   HEENT: nl dentition, turbinates, and orophanx. Nl external ear canals without cough reflex   NECK :  without JVD/Nodes/TM/ nl carotid upstrokes bilaterally   LUNGS: no acc muscle use, few insp squeaks and pops R lower chest anteriorly and laterally    CV:  RRR  no s3 or murmur or increase in P2, no edema   ABD:  soft and nontender with nl excursion in the supine position. No bruits or organomegaly, bowel sounds nl  MS:  warm without deformities, calf tenderness, cyanosis or clubbing  SKIN: warm and dry without lesions  NEURO:  alert, approp, no deficits     CXR PA and Lateral:   12/24/2014 :     I personally reviewed images and agree with radiology impression as follows:   No acute changes     Assessment & Plan:

## 2014-12-25 ENCOUNTER — Encounter: Payer: Self-pay | Admitting: Internal Medicine

## 2014-12-25 ENCOUNTER — Telehealth: Payer: Self-pay | Admitting: Internal Medicine

## 2014-12-25 DIAGNOSIS — R9389 Abnormal findings on diagnostic imaging of other specified body structures: Secondary | ICD-10-CM | POA: Insufficient documentation

## 2014-12-25 NOTE — Assessment & Plan Note (Signed)
No clear explanation by cxr/ will try off xarelto for now and rx with Zpak as has no def evidence of pna/ low threshhold for CTa

## 2014-12-25 NOTE — Telephone Encounter (Signed)
aware

## 2014-12-25 NOTE — Telephone Encounter (Signed)
Call report from Erskine Squibb at [facility] for CXR (report available in Epic) CXR shows new mass-like enlargement in R hilar region, concern for potential lymphadenopathy or central pulmonary mass.   CT chest suggested at this time.  Forwarding to MW to make aware.  Thanks!

## 2014-12-25 NOTE — Assessment & Plan Note (Signed)
He has had chronic pain in same area where he now has "new pain" per hx so hard to sort out but no obvious explanation by exam or cxr > rx symptomatically for now

## 2014-12-25 NOTE — Assessment & Plan Note (Signed)
Chronically not well controlled and requiring saba bid. DDX of  difficult airways management all start with A and  include Adherence, Ace Inhibitors, Acid Reflux, Active Sinus Disease, Alpha 1 Antitripsin deficiency, Anxiety masquerading as Airways dz,  ABPA,  allergy(esp in young), Aspiration (esp in elderly), Adverse effects of meds,  Active smokers, A bunch of PE's (a small clot burden can't cause this syndrome unless there is already severe underlying pulm or vascular dz with poor reserve) plus two Bs  = Bronchiectasis and Beta blocker use..and one C= CHF  Adherence is always the initial "prime suspect" and is a multilayered concern that requires a "trust but verify" approach in every patient - starting with knowing how to use medications, especially inhalers, correctly, keeping up with refills and understanding the fundamental difference between maintenance and prns vs those medications only taken for a very short course and then stopped and not refilled.  The proper method of use, as well as anticipated side effects, of a metered-dose inhaler are discussed and demonstrated to the patient. Improved effectiveness after extensive coaching during this visit to a level of approximately  75% so try dulera 200 2bid  ? Adverse effects of dpi > try hfa  ? A PE's > very unlikely in same area where he no longer has much lung tissue while on xarelto, esp since he is sp ivc filter so ok to leave off xarelto for now and restart at qod dosing until sort this out   I had an extended discussion with the patient reviewing all relevant studies completed to date and  lasting  35 m   Each maintenance medication was reviewed in detail including most importantly the difference between maintenance and prns and under what circumstances the prns are to be triggered using an action plan format that is not reflected in the computer generated alphabetically organized AVS.    Please see instructions for details which were  reviewed in writing and the patient given a copy highlighting the part that I personally wrote and discussed at today's ov.

## 2015-01-01 NOTE — Progress Notes (Signed)
Quick Note:  lmtcb for pt. ______ 

## 2015-01-04 ENCOUNTER — Telehealth: Payer: Self-pay | Admitting: Internal Medicine

## 2015-01-04 ENCOUNTER — Other Ambulatory Visit: Payer: Self-pay | Admitting: Internal Medicine

## 2015-01-04 DIAGNOSIS — R042 Hemoptysis: Secondary | ICD-10-CM

## 2015-01-04 DIAGNOSIS — R9389 Abnormal findings on diagnostic imaging of other specified body structures: Secondary | ICD-10-CM

## 2015-01-04 NOTE — Telephone Encounter (Signed)
Pt is aware of results. He would like to proceed with the blood work and CT. Orders have been placed. Nothing further was needed.

## 2015-01-04 NOTE — Telephone Encounter (Signed)
Notes Recorded by Nicanor Alcon, RN on 01/01/2015 at 4:46 PM lmtcb for pt. Notes Recorded by Maisie Fus, CMA on 12/31/2014 at 10:08 AM LMTCB x 3 Notes Recorded by Tommie Sams, CMA on 12/30/2014 at 3:49 PM Called pt and LMTCB x2 on named VM Notes Recorded by Lorel Monaco, CMA on 12/25/2014 at 10:52 AM lmtcb x1 for pt. Notes Recorded by Nyoka Cowden, MD on 12/25/2014 at 8:46 AM Call pt: Reviewed cxr and radiology is concerned about an area on R which may be source of bleeding needs cbc/bmet and CTa chest before next ov But not rush -----------   lmomtcb x1

## 2015-01-04 NOTE — Telephone Encounter (Signed)
Pt cb, would not leave number to call backs states we have the number and he called at 11am and has yet to receive cb and its been 3 days he has been trying to get results, and he hung up

## 2015-01-04 NOTE — Telephone Encounter (Signed)
Pt called back hung up said if we want to call him we will

## 2015-01-08 ENCOUNTER — Telehealth: Payer: Self-pay | Admitting: Internal Medicine

## 2015-01-08 ENCOUNTER — Ambulatory Visit (INDEPENDENT_AMBULATORY_CARE_PROVIDER_SITE_OTHER)
Admission: RE | Admit: 2015-01-08 | Discharge: 2015-01-08 | Disposition: A | Discharge status: Home / Self Care | Payer: Medicaid Other | Source: Ambulatory Visit | Attending: Internal Medicine | Admitting: Internal Medicine

## 2015-01-08 ENCOUNTER — Other Ambulatory Visit (INDEPENDENT_AMBULATORY_CARE_PROVIDER_SITE_OTHER): Payer: Medicaid Other

## 2015-01-08 DIAGNOSIS — R9389 Abnormal findings on diagnostic imaging of other specified body structures: Secondary | ICD-10-CM

## 2015-01-08 DIAGNOSIS — R938 Abnormal findings on diagnostic imaging of other specified body structures: Secondary | ICD-10-CM | POA: Diagnosis not present

## 2015-01-08 DIAGNOSIS — R042 Hemoptysis: Secondary | ICD-10-CM | POA: Diagnosis not present

## 2015-01-08 LAB — CBC WITH DIFFERENTIAL/PLATELET
BASOS PCT: 0.5 % (ref 0.0–3.0)
Basophils Absolute: 0 10*3/uL (ref 0.0–0.1)
EOS PCT: 1.6 % (ref 0.0–5.0)
Eosinophils Absolute: 0.1 10*3/uL (ref 0.0–0.7)
HCT: 45.1 % (ref 39.0–52.0)
Hemoglobin: 15.4 g/dL (ref 13.0–17.0)
LYMPHS ABS: 1.9 10*3/uL (ref 0.7–4.0)
Lymphocytes Relative: 33.1 % (ref 12.0–46.0)
MCHC: 34.1 g/dL (ref 30.0–36.0)
MCV: 97.3 fl (ref 78.0–100.0)
MONOS PCT: 10.9 % (ref 3.0–12.0)
Monocytes Absolute: 0.6 10*3/uL (ref 0.1–1.0)
NEUTROS ABS: 3 10*3/uL (ref 1.4–7.7)
NEUTROS PCT: 53.9 % (ref 43.0–77.0)
PLATELETS: 120 10*3/uL — AB (ref 150.0–400.0)
RBC: 4.63 Mil/uL (ref 4.22–5.81)
RDW: 12.7 % (ref 11.5–15.5)
WBC: 5.7 10*3/uL (ref 4.0–10.5)

## 2015-01-08 LAB — BASIC METABOLIC PANEL
BUN: 12 mg/dL (ref 6–23)
CO2: 30 meq/L (ref 19–32)
Calcium: 9.2 mg/dL (ref 8.4–10.5)
Chloride: 103 mEq/L (ref 96–112)
Creatinine, Ser: 0.96 mg/dL (ref 0.40–1.50)
GFR: 85.79 mL/min (ref >60.00)
GLUCOSE: 92 mg/dL (ref 70–99)
POTASSIUM: 4.5 meq/L (ref 3.5–5.1)
SODIUM: 138 meq/L (ref 135–145)

## 2015-01-08 MED ORDER — IOHEXOL 350 MG/ML SOLN
80.0000 mL | Freq: Once | INTRAVENOUS | Status: AC | PRN
  Administered 2015-01-08: 80 mL via INTRAVENOUS

## 2015-01-08 NOTE — Progress Notes (Signed)
Quick Note:  Spoke with pt and notified of results per Dr. Wert. Pt verbalized understanding and denied any questions.  ______ 

## 2015-01-08 NOTE — Telephone Encounter (Signed)
Spoke with pt and he is wanting results of Chest Ct done this morning.  Results are in epic.  Please advise. Pt can be reached at [phone] - ok to leave message if no asnwer

## 2015-01-08 NOTE — Telephone Encounter (Signed)
No explanation for hemoptysis/ be sure has f/u ov to regroup w/in 2 weeks but no new rx in meantime and will discuss in more detail then (main finding  is there is no evidence of any cancer or tb)

## 2015-01-08 NOTE — Telephone Encounter (Signed)
Left message with pt's wife for pt to call back.  

## 2015-01-08 NOTE — Telephone Encounter (Signed)
Spoke with pt, aware of below recs.  Already scheduled on 9/9 with TP, kept appt.  Nothing further needed.

## 2015-01-08 NOTE — Telephone Encounter (Signed)
lmtcb

## 2015-01-12 NOTE — Telephone Encounter (Signed)
Patient calling to let Dr. Sherene Sires know that he started back on Xarelto .  Patient also called to cancel appointment with Tammy on 9/9  FYI to Dr. Sherene Sires

## 2015-01-12 NOTE — Telephone Encounter (Signed)
lmomtcb x 2  

## 2015-01-19 ENCOUNTER — Telehealth: Payer: Self-pay | Admitting: Internal Medicine

## 2015-01-19 NOTE — Telephone Encounter (Signed)
LMOM for pt to return call. 

## 2015-01-19 NOTE — Telephone Encounter (Signed)
lmtcb X1 for pt  

## 2015-01-20 ENCOUNTER — Encounter: Payer: Self-pay | Admitting: Internal Medicine

## 2015-01-20 ENCOUNTER — Ambulatory Visit (INDEPENDENT_AMBULATORY_CARE_PROVIDER_SITE_OTHER): Payer: Medicaid Other | Admitting: Internal Medicine

## 2015-01-20 ENCOUNTER — Ambulatory Visit (HOSPITAL_COMMUNITY)
Admission: RE | Admit: 2015-01-20 | Discharge: 2015-01-20 | Disposition: A | Discharge status: Home / Self Care | Payer: Medicaid Other | Source: Ambulatory Visit | Attending: Urology | Admitting: Urology

## 2015-01-20 ENCOUNTER — Telehealth: Payer: Self-pay | Admitting: Pulmonary Disease

## 2015-01-20 VITALS — BP 108/64 | HR 60 | Temp 97.8°F | Ht 66.0 in | Wt 183.0 lb

## 2015-01-20 DIAGNOSIS — Z7902 Long term (current) use of antithrombotics/antiplatelets: Secondary | ICD-10-CM | POA: Diagnosis not present

## 2015-01-20 DIAGNOSIS — J449 Chronic obstructive pulmonary disease, unspecified: Secondary | ICD-10-CM

## 2015-01-20 DIAGNOSIS — R042 Hemoptysis: Secondary | ICD-10-CM

## 2015-01-20 DIAGNOSIS — I10 Essential (primary) hypertension: Secondary | ICD-10-CM | POA: Insufficient documentation

## 2015-01-20 DIAGNOSIS — Z86711 Personal history of pulmonary embolism: Secondary | ICD-10-CM | POA: Diagnosis not present

## 2015-01-20 DIAGNOSIS — R938 Abnormal findings on diagnostic imaging of other specified body structures: Secondary | ICD-10-CM

## 2015-01-20 DIAGNOSIS — Z86718 Personal history of other venous thrombosis and embolism: Secondary | ICD-10-CM

## 2015-01-20 DIAGNOSIS — G8922 Chronic post-thoracotomy pain: Secondary | ICD-10-CM

## 2015-01-20 DIAGNOSIS — R0602 Shortness of breath: Secondary | ICD-10-CM | POA: Insufficient documentation

## 2015-01-20 DIAGNOSIS — R9389 Abnormal findings on diagnostic imaging of other specified body structures: Secondary | ICD-10-CM

## 2015-01-20 MED ORDER — MOMETASONE FURO-FORMOTEROL FUM 200-5 MCG/ACT IN AERO: INHALATION_SPRAY | RESPIRATORY_TRACT | Status: AC

## 2015-01-20 MED ORDER — TRAMADOL HCL 50 MG PO TABS: ORAL_TABLET | ORAL | Status: AC

## 2015-01-20 NOTE — Assessment & Plan Note (Signed)
See cxr 12/24/14 > CTa done 01/08/15 1. No definitive explanation for hemoptysis. Right hilar chest x-ray findings correlate with postoperative scar. 2. No evidence of acute pulmonary embolism. 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 3. Emphysema and chronic bronchitis. 4. Bilateral pulmonary nodules up to 5 mm on the right. - placed in tickle file for recall 07/10/14    No explanation for hemoptysis or cp but breathing better at this point and no further w/u needed other than recall ct s contrast as planned   Discussed in detail all the  indications, usual  risks and alternatives  relative to the benefits with patient who agrees to proceed with conservative f/u as outlined  As long as we address his symptoms adequately (see sep a/p)

## 2015-01-20 NOTE — Telephone Encounter (Signed)
Made an appt for patient today at 1:30 to see Dr. Sherene Sires.

## 2015-01-20 NOTE — Telephone Encounter (Signed)
Pt needs an appt  OK to overbook MW  [facility]

## 2015-01-20 NOTE — Progress Notes (Signed)
Subjective:    Patient ID: [name], male    DOB: 30-Sep-1955,    MRN: [id number]    Brief patient profile:  37 yowm completely quit smoking around 2008 s/p RLung surgery 2008  followed by [name] in [place]   DATE OF ADMISSION: 04/08/2007 DATE OF DISCHARGE: 05/18/2007   DISCHARGE SUMMARY  DISCHARGE DIAGNOSES: 1. Chronic obstructive pulmonary disease with pulmonary embolism,  pleural effusion, and empyema status post thoracotomy by Dr.  Karle Rogers with placement of tracheostomy, again by Dr. Edwyna Rogers. 2. Delirium. 3. Atrial fibrillation with valvular stenosis, mitral and congestive  heart failure. 4. Leukocytosis. 5. Hyperglycemia.  HISTORY OF PRESENT ILLNESS: [name] is a 59 year old male with a history of PE with pulmonary infarct on February 22, 2007 with recurrent pneumonia. He was transferred from [facility] on April 08, 2007 with hemoptysis. He developed necrosis of the right lower lung with bronchopleural effusion, fistula, and empyema and hydropneumothorax. He had an IVC filter place on April 10, 2007. He had a right thoracotomy on April 15, 2007 by Dr. Karle Rogers with a right lower lobectomy, pleurectomy, and a lung biopsy.  PAST MEDICAL HISTORY: 1. Chronic obstructive pulmonary disease. 2. Atrial fibrillation. 3. Mild to moderate mitral stenosis. 4. Rheumatic fever. 5. Diffuse pulmonary nodules. 6. Diastolic dysfunction.  PAST SURGICAL HISTORY: 1. Right lower lobectomy and pleurectomy with chest tube placement on  April 15, 2007. 2. He had a left upper extremity PIC placed on April 21, 2007 to  May 10, 2007.  MEDICATIONS: Antibiotics consisted of Ancef, Zosyn, cefuroxime. He also has been on proton pump inhibitors. He has an IVC filter placed and treated with Lovenox.  DIAGNOSTIC STUDIES: His sputum grew [name],  resistant to ampicillin, Unasyn and Ciprofloxacin. Sputum showed [name]. Blood cultures x2 demonstrated coag negative staph. Left thoracentesis on May 01, 2007 was negative. Sputum culture on May 10, 2007 showed [name]. PIC tip on May 10, 2007 was negative. A TEE on April 10, 2007 showed ejection fraction of 50% with mild to moderate stenosis, mild to moderate tricuspid regurgitation.  LABORATORY DATA: Sodium 135, potassium 4.2, chloride 101, bicarb 26, glucose 98, BUN 13, creatinine 0.87, calcium 8.6, total protein 8.1, SGOT is 20, SGPT is 15, total bilirubin 0.7, alkaline phosphatase 97.  HOSPITAL COURSE BY DISCHARGE DIAGNOSES: 1. Status post vent dependent respiratory failure, requiring oral  tracheal intubation, tracheostomy, and subsequent decannulation. He  was admitted to [facility] on April 08, 2007. He  underwent a right lower lobectomy and pleurectomy with a chest tube  placement per Dr. Karle Rogers. He was successfully ready to  mechanical support x2 and then subsequently required re-intubation  with a tracheostomy placement by Dr. Karle Rogers. He was  successfully decannulated from tracheostomy and was discharged home  with a healing tracheostomy scar. 2. Chronic obstructive pulmonary disease: He remains on his current  medications. 3. Pleural effusion, which was drained. 4. Empyema, which is status post thoracotomy. 5. Delirium, which is improved and resolved prior to discharge. 6. Atrial fibrillation, with valvular stenosis, mitral and congestive  heart failure. He had an EP evaluation with Coumadin restarted. 7. Leukocytosis, improved after his infectious processes were treated  with appropriate antibiotics. 8. Hyperglycemia, which was treated with sliding scale insulin and  resolved prior to discharge.  DISCHARGE MEDICATIONS: 1. Coumadin 5 mg 1/2 tablet  daily. 2. Xopenex MDI 2 sprays 4 times daily. 3. Flovent 220 2 puffs daily. 4. Cardizem 240 mg daily. 5. Metoprolol  50 mg twice daily. 6. Protonix 40 mg daily. 7. Mucinex 600 mg twice daily.      12/24/2014 1st [place]   office visit/ [name]  / self referral / on chronic daily xarelto (never misses dose)  Chief Complaint  Patient presents with  . Acute Visit    Pt c/o hemoptysis and right side rib pain x 2 days- started after he was digging a ditch. He states his breathing has also been slightly worse.   abrupt 12/22/14 new R lower pleuritic ant cp while digging then w/in a few minutes noted onset of hemoptysis x sev tbsp Does have a chronic cough with white mucus attributes to copd on advair / occ 02 at bedtime but not during the day Pain and hemoptysis have persisted since onset but really min sob over baseline = needing saba avg of bid while on advair rec Stop advair and start dulera 200 Take 2 puffs first thing in am and then another 2 puffs about 12 hours later.  Only use your Proair (Red inhaler) as a rescue medication  Stop xarelto until bleeding stops then take every other day  zpak x one CTa chest neg x mpns      01/20/2015 f/u ov/[name] re: recurrent hemoptysis/ chronic cp  Chief Complaint  Patient presents with  . Acute Visit    Pt states having hemoptysis again for the past 4 days. He states this happened right after starting back on xarelto.  He states he is bringing up at least a tbs of blood when he coughs. He is also having increased SOB and right side CP.   breathing bettter and not using rescue at all since started dulera   R ant cp dates back to surgery and corresponds to dermatome along the surgical scar but distal to the actual wound,  worse with coughing and when push on the area    No obvious day to day or daytime variability or assoc   chest tightness, subjective wheeze or overt sinus /epistaxis or hb symptoms. No unusual exp hx or h/o childhood pna/  asthma or knowledge of premature birth.  Sleeping ok without nocturnal  or early am exacerbation  of respiratory  c/o's or need for noct saba. Also denies any obvious fluctuation of symptoms with weather or environmental changes or other aggravating or alleviating factors except as outlined above   Current Medications, Allergies, Complete Past Medical History, Past Surgical History, Family History, and Social History were reviewed in [employer] record.  ROS  The following are not active complaints unless bolded sore throat, dysphagia, dental problems, itching, sneezing,  nasal congestion or excess/ purulent secretions, ear ache,   fever, chills, sweats, unintended wt loss, classically  exertional cp, hemoptysis,  orthopnea pnd or leg swelling, presyncope, palpitations, abdominal pain, anorexia, nausea, vomiting, diarrhea  or change in bowel or bladder habits, change in stools or urine, dysuria,hematuria,  rash, arthralgias, visual complaints, headache, numbness, weakness or ataxia or problems with walking or coordination,  change in mood/affect or memory.                 Objective:   Physical Exam  amb wm nad/ somber with somewhat of a hopeless affect   01/20/2015         183  Wt Readings from Last 3 Encounters:  12/24/14 183 lb 3.2 oz (83.099 kg)  09/09/10 222 lb (100.699 kg)  09/02/10 222 lb 9.6 oz (100.971 kg)    Vital signs reviewed  HEENT: nl dentition, turbinates, and orophanx. Nl external ear canals without cough reflex   NECK :  without JVD/Nodes/TM/ nl carotid upstrokes bilaterally   LUNGS: no acc muscle use, distant bs s adventitial sounds /slt barrel chest    CV:  RRR  no s3 or murmur or increase in P2, no edema   ABD:  soft and nontender with nl excursion in the supine position. No bruits or organomegaly, bowel sounds nl  MS:  warm without deformities, calf tenderness, cyanosis or clubbing  SKIN: warm and dry without lesions    NEURO:   alert, approp, no deficits      I personally reviewed images and agree with radiology impression as follows:  CTa  Chest 01/08/15 1. No definitive explanation for hemoptysis. Right hilar chest x-ray findings correlate with postoperative scar. 2. No evidence of acute pulmonary embolism. 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 3. Emphysema and chronic bronchitis. 4. Bilateral pulmonary nodules up to 5 mm on the right. Follow-up chest CT at 6 - 12 months is recommended     Assessment & Plan:

## 2015-01-20 NOTE — Assessment & Plan Note (Signed)
-   12/24/2014 p extensive coaching HFA effectiveness =    75% > try dulera 200 2bid   Definitely breathing better with less need for saba on laba/ics > continue pending f/u pft ? Change to laba/lama next

## 2015-01-20 NOTE — Patient Instructions (Addendum)
Please see patient coordinator before you leave today  to schedule Venous dopplers both legs.  Stop xarelto for now.  Continue prilosec 20 mg Take 30-60 min before first meal of the day and pepcid 20 mg at bedtime   GERD (REFLUX)  is an extremely common cause of respiratory symptoms just like yours , many times with no obvious heartburn at all.    It can be treated with medication, but also with lifestyle changes including elevation of the head of your bed (ideally with 6 inch  bed blocks),  Smoking cessation, avoidance of late meals, excessive alcohol, and avoid fatty foods, chocolate, peppermint, colas, red wine, and acidic juices such as orange juice.  NO MINT OR MENTHOL PRODUCTS SO NO COUGH DROPS  USE SUGARLESS CANDY INSTEAD (Jolley ranchers or Stover's or Life Savers) or even ice chips will also do - the key is to swallow to prevent all throat clearing. NO OIL BASED VITAMINS - use powdered substitutes.   zostrix cream apply four times a day for at least a couple of weeks then taper to bedtime only   For pain or cough > tramadol 50 mg one every 4 hours as needed   Please schedule a follow up office visit in 6 weeks, call sooner if needed with pfts

## 2015-01-20 NOTE — Assessment & Plan Note (Signed)
-   IVC filter in place since 2008  - d/c xarelto due to recurrent hemoptysis 01/20/2015  - venous dopplers 01/20/2015 >>>   Discussed in detail all the  indications, usual  risks and alternatives  relative to the benefits with patient who agrees to proceed with trial off xarelto and if bleeding resolves consider starting back on baby asa daily

## 2015-01-20 NOTE — Telephone Encounter (Signed)
lmtcb for pt.  

## 2015-01-20 NOTE — Assessment & Plan Note (Signed)
Classic pattern by hx and by exam (same dermatome with the linear thoractomy scar around C6 on R > discussed neuralgia rx options, agreed to rx with zostrix as has never tried it before

## 2015-01-20 NOTE — Assessment & Plan Note (Signed)
Onset 12/22/14 on full dose xarelto rec reduce to 20 mg qod once bleeding stops - CTa 01/08/15 1. No definitive explanation for hemoptysis. Right hilar chest x-ray findings correlate with postoperative scar. - Trial off xarelto 01/20/2015 >>>

## 2015-01-20 NOTE — Telephone Encounter (Signed)
Called by radiology. Venous ultrasound is negative for PE. Patient sent home from study. Will require telephone follow up from Dr. Sherene Sires in AM.

## 2015-01-20 NOTE — Telephone Encounter (Signed)
Pt returning call and can be reached @ [phone], pt said to leave msg he is not always in the house.Caren Griffins

## 2015-01-21 NOTE — Telephone Encounter (Signed)
lmomtcb x 3  

## 2015-01-22 ENCOUNTER — Ambulatory Visit: Payer: Medicaid Other | Admitting: Adult Health

## 2015-01-22 NOTE — Telephone Encounter (Signed)
Spoke with pt and he found rx for [facility] Dr Sherene Sires had given him and filled it at pharmacy.  Nothing further is needed.

## 2015-01-22 NOTE — Progress Notes (Signed)
Quick Note:  LMTCB- left details on results/recs but LMTCB to ensure he got the msg and no questions ______

## 2015-01-25 ENCOUNTER — Ambulatory Visit: Payer: Medicaid Other | Admitting: Internal Medicine

## 2015-01-25 NOTE — Progress Notes (Signed)
Quick Note:  LMTCB ______ 

## 2015-01-29 NOTE — Progress Notes (Signed)
Quick Note:  Attempted to call pt Unable to leave VM ______

## 2015-03-03 ENCOUNTER — Ambulatory Visit: Payer: Medicaid Other | Admitting: Internal Medicine

## 2015-06-04 ENCOUNTER — Other Ambulatory Visit: Payer: Self-pay | Admitting: Internal Medicine

## 2015-06-04 DIAGNOSIS — R9389 Abnormal findings on diagnostic imaging of other specified body structures: Secondary | ICD-10-CM

## 2015-06-08 ENCOUNTER — Telehealth: Payer: Self-pay | Admitting: Internal Medicine

## 2015-06-08 NOTE — Telephone Encounter (Signed)
Noted  

## 2015-07-06 ENCOUNTER — Telehealth: Payer: Self-pay | Admitting: *Deleted

## 2015-07-06 NOTE — Telephone Encounter (Signed)
-----   Message from Nyoka Cowden, MD sent at 01/09/2015 10:16 AM EDT ----- Ct s contrast due f/u mpns

## 2015-07-06 NOTE — Telephone Encounter (Signed)
See phone note dated 06/08/15

## 2015-07-12 ENCOUNTER — Other Ambulatory Visit: Payer: Medicaid Other

## 2022-11-13 DEATH — deceased
